# Patient Record
Sex: Female | Born: 1960
Health system: Southern US, Community
[De-identification: ages and names within clinical notes are randomized; demographics above are authoritative.]

## PROBLEM LIST (undated history)

## (undated) DIAGNOSIS — F319 Bipolar disorder, unspecified: Secondary | ICD-10-CM

## (undated) DIAGNOSIS — E785 Hyperlipidemia, unspecified: Secondary | ICD-10-CM

## (undated) DIAGNOSIS — F419 Anxiety disorder, unspecified: Secondary | ICD-10-CM

## (undated) DIAGNOSIS — E119 Type 2 diabetes mellitus without complications: Secondary | ICD-10-CM

## (undated) DIAGNOSIS — F1910 Other psychoactive substance abuse, uncomplicated: Secondary | ICD-10-CM

## (undated) DIAGNOSIS — F329 Major depressive disorder, single episode, unspecified: Secondary | ICD-10-CM

## (undated) DIAGNOSIS — F32A Depression, unspecified: Secondary | ICD-10-CM

## (undated) DIAGNOSIS — Z720 Tobacco use: Secondary | ICD-10-CM

## (undated) DIAGNOSIS — F429 Obsessive-compulsive disorder, unspecified: Secondary | ICD-10-CM

## (undated) DIAGNOSIS — M1990 Unspecified osteoarthritis, unspecified site: Secondary | ICD-10-CM

## (undated) HISTORY — DX: Obsessive-compulsive disorder, unspecified: F42.9

## (undated) HISTORY — DX: Anxiety disorder, unspecified: F41.9

## (undated) HISTORY — DX: Type 2 diabetes mellitus without complications: E11.9

## (undated) HISTORY — DX: Depression, unspecified: F32.A

## (undated) HISTORY — PX: TONSILLECTOMY: SUR1361

## (undated) HISTORY — PX: TUBAL LIGATION: SHX77

## (undated) HISTORY — DX: Bipolar disorder, unspecified: F31.9

## (undated) HISTORY — DX: Tobacco use: Z72.0

## (undated) HISTORY — DX: Major depressive disorder, single episode, unspecified: F32.9

## (undated) HISTORY — PX: DILATION AND CURETTAGE OF UTERUS: SHX78

## (undated) HISTORY — DX: Other psychoactive substance abuse, uncomplicated: F19.10

## (undated) HISTORY — DX: Hyperlipidemia, unspecified: E78.5

---

## 1998-06-12 ENCOUNTER — Other Ambulatory Visit: Admission: RE | Admit: 1998-06-12 | Discharge: 1998-06-12 | Payer: Self-pay | Admitting: *Deleted

## 1999-01-01 ENCOUNTER — Emergency Department (HOSPITAL_COMMUNITY): Admission: EM | Admit: 1999-01-01 | Discharge: 1999-01-01 | Payer: Self-pay | Admitting: Emergency Medicine

## 1999-01-01 ENCOUNTER — Encounter: Payer: Self-pay | Admitting: Emergency Medicine

## 2001-08-28 ENCOUNTER — Other Ambulatory Visit: Admission: RE | Admit: 2001-08-28 | Discharge: 2001-08-28 | Payer: Self-pay | Admitting: Internal Medicine

## 2001-09-04 ENCOUNTER — Encounter: Payer: Self-pay | Admitting: Internal Medicine

## 2001-09-04 ENCOUNTER — Encounter: Admission: RE | Admit: 2001-09-04 | Discharge: 2001-09-04 | Payer: Self-pay | Admitting: Internal Medicine

## 2002-11-01 ENCOUNTER — Other Ambulatory Visit: Admission: RE | Admit: 2002-11-01 | Discharge: 2002-11-01 | Payer: Self-pay | Admitting: Obstetrics and Gynecology

## 2004-07-09 ENCOUNTER — Ambulatory Visit: Payer: Self-pay | Admitting: Internal Medicine

## 2004-07-13 ENCOUNTER — Ambulatory Visit: Payer: Self-pay | Admitting: Internal Medicine

## 2005-01-07 ENCOUNTER — Ambulatory Visit: Payer: Self-pay | Admitting: Internal Medicine

## 2005-01-11 ENCOUNTER — Ambulatory Visit: Payer: Self-pay | Admitting: Internal Medicine

## 2005-04-09 ENCOUNTER — Ambulatory Visit: Payer: Self-pay | Admitting: Internal Medicine

## 2005-04-15 ENCOUNTER — Ambulatory Visit: Payer: Self-pay | Admitting: Internal Medicine

## 2005-06-04 ENCOUNTER — Ambulatory Visit: Payer: Self-pay | Admitting: Internal Medicine

## 2005-06-18 ENCOUNTER — Ambulatory Visit: Payer: Self-pay | Admitting: Internal Medicine

## 2005-08-05 ENCOUNTER — Ambulatory Visit: Payer: Self-pay | Admitting: Internal Medicine

## 2005-08-13 ENCOUNTER — Ambulatory Visit: Payer: Self-pay | Admitting: Internal Medicine

## 2005-10-30 ENCOUNTER — Ambulatory Visit (HOSPITAL_COMMUNITY): Admission: RE | Admit: 2005-10-30 | Discharge: 2005-10-30 | Payer: Self-pay | Admitting: Obstetrics and Gynecology

## 2005-10-30 ENCOUNTER — Encounter (INDEPENDENT_AMBULATORY_CARE_PROVIDER_SITE_OTHER): Payer: Self-pay | Admitting: Specialist

## 2005-12-03 ENCOUNTER — Ambulatory Visit: Payer: Self-pay | Admitting: Internal Medicine

## 2005-12-10 ENCOUNTER — Ambulatory Visit: Payer: Self-pay | Admitting: Internal Medicine

## 2006-06-11 ENCOUNTER — Ambulatory Visit: Payer: Self-pay | Admitting: Internal Medicine

## 2006-06-11 LAB — CONVERTED CEMR LAB
CO2: 28 meq/L (ref 19–32)
Calcium: 9.3 mg/dL (ref 8.4–10.5)
Chloride: 104 meq/L (ref 96–112)
Creatinine, Ser: 0.9 mg/dL (ref 0.4–1.2)
GFR calc non Af Amer: 72 mL/min
Glucose, Bld: 150 mg/dL — ABNORMAL HIGH (ref 70–99)

## 2006-06-17 ENCOUNTER — Ambulatory Visit: Payer: Self-pay | Admitting: Internal Medicine

## 2006-06-25 ENCOUNTER — Ambulatory Visit: Payer: Self-pay | Admitting: Internal Medicine

## 2006-09-05 ENCOUNTER — Ambulatory Visit: Payer: Self-pay | Admitting: Internal Medicine

## 2006-09-16 ENCOUNTER — Ambulatory Visit: Payer: Self-pay | Admitting: Internal Medicine

## 2006-09-16 LAB — CONVERTED CEMR LAB
Albumin: 3.6 g/dL (ref 3.5–5.2)
Alkaline Phosphatase: 51 units/L (ref 39–117)
BUN: 9 mg/dL (ref 6–23)
Basophils Absolute: 0.1 10*3/uL (ref 0.0–0.1)
Bilirubin Urine: NEGATIVE
Creatinine, Ser: 0.8 mg/dL (ref 0.4–1.2)
GFR calc Af Amer: 99 mL/min
HDL: 44.7 mg/dL (ref >39.0)
Hemoglobin, Urine: NEGATIVE
Hemoglobin: 14 g/dL (ref 12.0–15.0)
LDL Cholesterol: 75 mg/dL (ref 0–99)
Leukocytes, UA: NEGATIVE
Lithium Lvl: <0.25 meq/L — ABNORMAL LOW (ref 0.80–1.40)
MCHC: 34 g/dL (ref 30.0–36.0)
Monocytes Absolute: 0.6 10*3/uL (ref 0.2–0.7)
Monocytes Relative: 7.9 % (ref 3.0–11.0)
Neutro Abs: 3.8 10*3/uL (ref 1.4–7.7)
Potassium: 4.2 meq/L (ref 3.5–5.1)
RDW: 12.3 % (ref 11.5–14.6)
TSH: 1.73 microintl units/mL (ref 0.35–5.50)
Total CHOL/HDL Ratio: 3.1
Triglycerides: 88 mg/dL (ref 0–149)
Urine Glucose: NEGATIVE mg/dL
VLDL: 18 mg/dL (ref 0–40)
pH: 6 (ref 5.0–8.0)

## 2006-09-24 ENCOUNTER — Ambulatory Visit: Payer: Self-pay | Admitting: Internal Medicine

## 2006-10-02 ENCOUNTER — Encounter: Admission: RE | Admit: 2006-10-02 | Discharge: 2006-10-02 | Payer: Self-pay | Admitting: Internal Medicine

## 2006-12-19 DIAGNOSIS — Z8639 Personal history of other endocrine, nutritional and metabolic disease: Secondary | ICD-10-CM | POA: Insufficient documentation

## 2006-12-19 DIAGNOSIS — F411 Generalized anxiety disorder: Secondary | ICD-10-CM

## 2006-12-19 DIAGNOSIS — Z8659 Personal history of other mental and behavioral disorders: Secondary | ICD-10-CM | POA: Insufficient documentation

## 2006-12-19 DIAGNOSIS — F3176 Bipolar disorder, in full remission, most recent episode depressed: Secondary | ICD-10-CM | POA: Insufficient documentation

## 2006-12-19 DIAGNOSIS — Z87898 Personal history of other specified conditions: Secondary | ICD-10-CM | POA: Insufficient documentation

## 2007-01-21 ENCOUNTER — Ambulatory Visit: Payer: Self-pay | Admitting: Internal Medicine

## 2007-04-14 ENCOUNTER — Telehealth: Payer: Self-pay | Admitting: Internal Medicine

## 2007-04-15 ENCOUNTER — Telehealth: Payer: Self-pay | Admitting: Internal Medicine

## 2007-04-16 ENCOUNTER — Ambulatory Visit: Payer: Self-pay | Admitting: Internal Medicine

## 2007-04-16 DIAGNOSIS — Z8639 Personal history of other endocrine, nutritional and metabolic disease: Secondary | ICD-10-CM | POA: Insufficient documentation

## 2007-04-16 DIAGNOSIS — I10 Essential (primary) hypertension: Secondary | ICD-10-CM | POA: Insufficient documentation

## 2007-04-16 DIAGNOSIS — E119 Type 2 diabetes mellitus without complications: Secondary | ICD-10-CM

## 2007-04-16 HISTORY — DX: Type 2 diabetes mellitus without complications: E11.9

## 2007-04-16 LAB — CONVERTED CEMR LAB
ALT: 25 units/L (ref 0–35)
BUN: 8 mg/dL (ref 6–23)
Bilirubin, Direct: 0.1 mg/dL (ref 0.0–0.3)
Calcium: 9 mg/dL (ref 8.4–10.5)
Cholesterol: 165 mg/dL (ref 0–200)
Eosinophils Absolute: 0.2 10*3/uL (ref 0.0–0.6)
Eosinophils Relative: 2.1 % (ref 0.0–5.0)
GFR calc Af Amer: 86 mL/min
GFR calc non Af Amer: 71 mL/min
Glucose, Bld: 101 mg/dL — ABNORMAL HIGH (ref 70–99)
HDL: 50 mg/dL (ref >39.0)
Lymphocytes Relative: 34.3 % (ref 12.0–46.0)
MCV: 87.7 fL (ref 78.0–100.0)
Monocytes Relative: 6.8 % (ref 3.0–11.0)
Neutro Abs: 4.1 10*3/uL (ref 1.4–7.7)
Platelets: 237 10*3/uL (ref 150–400)
Potassium: 4.3 meq/L (ref 3.5–5.1)
TSH: 2.12 microintl units/mL (ref 0.35–5.50)
Triglycerides: 116 mg/dL (ref 0–149)

## 2007-04-21 ENCOUNTER — Ambulatory Visit: Payer: Self-pay | Admitting: Internal Medicine

## 2007-04-27 ENCOUNTER — Emergency Department (HOSPITAL_COMMUNITY): Admission: EM | Admit: 2007-04-27 | Discharge: 2007-04-27 | Payer: Self-pay | Admitting: Family Medicine

## 2007-06-09 ENCOUNTER — Ambulatory Visit: Payer: Self-pay | Admitting: Internal Medicine

## 2007-06-18 ENCOUNTER — Encounter: Payer: Self-pay | Admitting: Internal Medicine

## 2007-07-03 ENCOUNTER — Telehealth: Payer: Self-pay | Admitting: Internal Medicine

## 2007-08-03 ENCOUNTER — Telehealth: Payer: Self-pay | Admitting: Internal Medicine

## 2007-09-21 ENCOUNTER — Ambulatory Visit: Payer: Self-pay | Admitting: Internal Medicine

## 2007-09-21 ENCOUNTER — Encounter (INDEPENDENT_AMBULATORY_CARE_PROVIDER_SITE_OTHER): Payer: Self-pay | Admitting: Family Medicine

## 2007-09-21 ENCOUNTER — Ambulatory Visit: Payer: Self-pay | Admitting: *Deleted

## 2007-09-21 LAB — CONVERTED CEMR LAB
ALT: 21 units/L (ref 0–35)
AST: 17 units/L (ref 0–37)
Albumin: 4.2 g/dL (ref 3.5–5.2)
Alkaline Phosphatase: 58 units/L (ref 39–117)
Glucose, Bld: 70 mg/dL (ref 70–99)
LDL Cholesterol: 81 mg/dL (ref 0–99)
Microalb, Ur: 0.41 mg/dL (ref 0.00–1.89)
Potassium: 4.3 meq/L (ref 3.5–5.3)
Sodium: 138 meq/L (ref 135–145)
Total Protein: 7 g/dL (ref 6.0–8.3)
Triglycerides: 183 mg/dL — ABNORMAL HIGH (ref <150)
VLDL: 37 mg/dL (ref 0–40)

## 2007-10-29 ENCOUNTER — Ambulatory Visit: Payer: Self-pay | Admitting: Internal Medicine

## 2007-11-26 ENCOUNTER — Ambulatory Visit: Payer: Self-pay | Admitting: Internal Medicine

## 2007-12-09 ENCOUNTER — Ambulatory Visit: Payer: Self-pay | Admitting: Internal Medicine

## 2007-12-30 ENCOUNTER — Ambulatory Visit: Payer: Self-pay | Admitting: Internal Medicine

## 2007-12-30 ENCOUNTER — Encounter: Payer: Self-pay | Admitting: Family Medicine

## 2007-12-30 ENCOUNTER — Encounter (INDEPENDENT_AMBULATORY_CARE_PROVIDER_SITE_OTHER): Payer: Self-pay | Admitting: Family Medicine

## 2007-12-30 LAB — CONVERTED CEMR LAB
Basophils Absolute: 0 10*3/uL (ref 0.0–0.1)
Chlamydia, DNA Probe: NEGATIVE
GC Probe Amp, Genital: NEGATIVE
Lithium Lvl: 0.33 meq/L — ABNORMAL LOW (ref 0.80–1.40)
Lymphocytes Relative: 34 % (ref 12–46)
Lymphs Abs: 2.4 10*3/uL (ref 0.7–4.0)
Neutro Abs: 3.9 10*3/uL (ref 1.7–7.7)
Neutrophils Relative %: 56 % (ref 43–77)
Platelets: 230 10*3/uL (ref 150–400)
RDW: 13.9 % (ref 11.5–15.5)
TSH: 2.056 microintl units/mL (ref 0.350–4.50)
WBC: 7.1 10*3/uL (ref 4.0–10.5)

## 2008-01-12 ENCOUNTER — Ambulatory Visit (HOSPITAL_COMMUNITY): Admission: RE | Admit: 2008-01-12 | Discharge: 2008-01-12 | Payer: Self-pay | Admitting: Family Medicine

## 2008-01-15 ENCOUNTER — Ambulatory Visit: Payer: Self-pay | Admitting: Internal Medicine

## 2008-05-17 ENCOUNTER — Telehealth: Payer: Self-pay | Admitting: Internal Medicine

## 2008-06-24 ENCOUNTER — Emergency Department (HOSPITAL_COMMUNITY): Admission: EM | Admit: 2008-06-24 | Discharge: 2008-06-24 | Payer: Self-pay | Admitting: Family Medicine

## 2008-07-06 ENCOUNTER — Ambulatory Visit: Payer: Self-pay | Admitting: Internal Medicine

## 2008-09-22 ENCOUNTER — Ambulatory Visit: Payer: Self-pay | Admitting: Internal Medicine

## 2008-10-14 ENCOUNTER — Inpatient Hospital Stay (HOSPITAL_COMMUNITY): Admission: AD | Admit: 2008-10-14 | Discharge: 2008-10-14 | Payer: Self-pay | Admitting: Obstetrics & Gynecology

## 2008-10-20 ENCOUNTER — Inpatient Hospital Stay (HOSPITAL_COMMUNITY): Admission: RE | Admit: 2008-10-20 | Discharge: 2008-10-20 | Payer: Self-pay | Admitting: Obstetrics & Gynecology

## 2008-12-21 ENCOUNTER — Ambulatory Visit: Payer: Self-pay | Admitting: Internal Medicine

## 2009-04-03 ENCOUNTER — Ambulatory Visit: Payer: Self-pay | Admitting: Internal Medicine

## 2009-04-14 ENCOUNTER — Encounter (INDEPENDENT_AMBULATORY_CARE_PROVIDER_SITE_OTHER): Payer: Self-pay | Admitting: Adult Health

## 2009-04-14 ENCOUNTER — Ambulatory Visit: Payer: Self-pay | Admitting: Internal Medicine

## 2009-04-14 LAB — CONVERTED CEMR LAB
ALT: 27 units/L (ref 0–35)
AST: 26 units/L (ref 0–37)
Albumin: 4.6 g/dL (ref 3.5–5.2)
Alkaline Phosphatase: 79 units/L (ref 39–117)
BUN: 16 mg/dL (ref 6–23)
Calcium: 9.7 mg/dL (ref 8.4–10.5)
Chloride: 104 meq/L (ref 96–112)
Potassium: 4.1 meq/L (ref 3.5–5.3)

## 2009-07-29 ENCOUNTER — Emergency Department (HOSPITAL_COMMUNITY): Admission: EM | Admit: 2009-07-29 | Discharge: 2009-07-29 | Payer: Self-pay | Admitting: Family Medicine

## 2009-08-16 ENCOUNTER — Emergency Department (HOSPITAL_COMMUNITY): Admission: EM | Admit: 2009-08-16 | Discharge: 2009-08-16 | Payer: Self-pay | Admitting: Family Medicine

## 2009-10-04 ENCOUNTER — Encounter (INDEPENDENT_AMBULATORY_CARE_PROVIDER_SITE_OTHER): Payer: Self-pay | Admitting: Adult Health

## 2009-10-04 ENCOUNTER — Ambulatory Visit: Payer: Self-pay | Admitting: Internal Medicine

## 2009-10-04 LAB — CONVERTED CEMR LAB
ALT: 25 units/L (ref 0–35)
CO2: 25 meq/L (ref 19–32)
Calcium: 9.3 mg/dL (ref 8.4–10.5)
Chloride: 105 meq/L (ref 96–112)
Cholesterol: 204 mg/dL — ABNORMAL HIGH (ref 0–200)
Glucose, Bld: 95 mg/dL (ref 70–99)
Microalb, Ur: 0.5 mg/dL (ref 0.00–1.89)
Sodium: 140 meq/L (ref 135–145)
Total Bilirubin: 0.4 mg/dL (ref 0.3–1.2)
Total Protein: 7.3 g/dL (ref 6.0–8.3)
Triglycerides: 268 mg/dL — ABNORMAL HIGH (ref <150)
VLDL: 54 mg/dL — ABNORMAL HIGH (ref 0–40)
Vit D, 25-Hydroxy: 47 ng/mL (ref 30–89)

## 2009-10-31 ENCOUNTER — Inpatient Hospital Stay (HOSPITAL_COMMUNITY): Admission: AD | Admit: 2009-10-31 | Discharge: 2009-10-31 | Payer: Self-pay | Admitting: Family Medicine

## 2009-10-31 ENCOUNTER — Ambulatory Visit: Payer: Self-pay | Admitting: Obstetrics & Gynecology

## 2009-11-02 ENCOUNTER — Other Ambulatory Visit: Admission: RE | Admit: 2009-11-02 | Discharge: 2009-11-02 | Payer: Self-pay | Admitting: Obstetrics and Gynecology

## 2009-11-02 ENCOUNTER — Ambulatory Visit: Payer: Self-pay | Admitting: Obstetrics and Gynecology

## 2009-11-02 LAB — CONVERTED CEMR LAB
FSH: 58.5 milliintl units/mL
LH: 44.7 milliintl units/mL

## 2010-03-26 ENCOUNTER — Encounter (INDEPENDENT_AMBULATORY_CARE_PROVIDER_SITE_OTHER): Payer: Self-pay | Admitting: *Deleted

## 2010-03-26 LAB — CONVERTED CEMR LAB
Chlamydia, Swab/Urine, PCR: NEGATIVE
GC Probe Amp, Urine: NEGATIVE
Hgb A1c MFr Bld: 5.6 % (ref <5.7)

## 2010-06-04 ENCOUNTER — Encounter: Payer: Self-pay | Admitting: Internal Medicine

## 2010-06-04 ENCOUNTER — Ambulatory Visit
Admission: RE | Admit: 2010-06-04 | Discharge: 2010-06-04 | Payer: Self-pay | Source: Home / Self Care | Attending: Internal Medicine | Admitting: Internal Medicine

## 2010-06-04 DIAGNOSIS — M2550 Pain in unspecified joint: Secondary | ICD-10-CM | POA: Insufficient documentation

## 2010-06-05 ENCOUNTER — Telehealth: Payer: Self-pay | Admitting: Internal Medicine

## 2010-06-19 ENCOUNTER — Telehealth: Payer: Self-pay | Admitting: Internal Medicine

## 2010-06-28 NOTE — Progress Notes (Signed)
Summary: Call Report  Phone Note Other Incoming   Caller: [facility] Summary of Call: [facility] Triage Call Report Triage Record Num: [id number] Operator: Audelia Hives Patient Name: Yasmine Kilbourne Call Date & Time: 06/04/2010 7:14:16PM Patient Phone: [phone] PCP: Sonda Primes Patient Gender: Female PCP Fax : [phone] Patient DOB: 04/13/1961 Practice Name: [facility] Reason for Call: Jacki Cones calling regarding scripts for Tramadol 50 mg and Ibuprofen 600mg . States she saw Dr. Posey Rea for hip pain 06/04/10 at 1600 and meds were to be sent to [facility] and have not been received. Onset of hip pain x 1 year. Was given a shot today for pain and advised to F/U in 1 month. Emergent s/s for Hip-Non injury r/o per protocol except for see in 24 hours. Advised on Motrin 800 mg q 8 hrs as per office SO. Pt to call office in am for Tramadol. Protocol(s) Used: Hip Non-Injury Recommended Outcome per Protocol: See Provider within 24 hours Reason for Outcome: Persistent or worsening pain OR impaired functioning (change in normal gait, inability to remove socks or cross legs) even when following prescribed treatment Care Advice:  ~ Call provider if symptoms worsen or new symptoms develop. Limit weight-bearing activity until evaluated by provider. Avoid movements or exercises that aggravate symptoms, such as jogging, stair-climbing, prolonged standing, etc.  ~  ~ SYMPTOM / CONDITION MANAGEMENT 01/ Initial call taken by: Margaret Pyle, CMA,  June 05, 2010 8:34 AM  Follow-up for Phone Call        Record shows Rx went through Follow-up by: Tresa Garter MD,  June 05, 2010 1:04 PM

## 2010-06-28 NOTE — Miscellaneous (Signed)
Summary: Procedure Consent  Procedure Consent   Imported By: Lester [place] 06/08/2010 10:59:10  _____________________________________________________________________  External Attachment:    Type:   Image     Comment:   External Document

## 2010-06-28 NOTE — Assessment & Plan Note (Signed)
Summary: BACK AND HIP PAIN--LAST APPT W/DR AVP:  2009-SELF PAY/$125-BI.Marland KitchenMarland Kitchen   Vital Signs:  Patient profile:   50 year old female Height:      67 inches Weight:      197 pounds BMI:     30.97 Temp:     99.2 degrees F oral Pulse rate:   80 / minute Pulse rhythm:   regular Resp:     16 per minute BP sitting:   148 / 98  (left arm) Cuff size:   regular  Vitals Entered By: Lanier Prude, CMA(AAMA) (June 04, 2010 3:51 PM)  Procedure Note  Injections: The patient complains of pain and inflammation. Indication: acute pain Consent signed: yes  Procedure # 1: joint injection    Region: lateral    Location: L hip    Technique: 25 g needle    Medication: 80 mg depomedrol    Anesthesia: 4.0 ml 1% lidocaine w/o epinephrine    Comment: Skin was inj w/2cc 2% Lido prior Risks including but not limited by incomplete procedure, bleeding, infection, recurrence were discussed with the patient. Consent form was signed. Tolerated well. Complicatons - none. Good pain relief following the procedure.   Cleaned and prepped with: alcohol and betadine Wound dressing: bandaid  CC: Lt hip/LBP  Is Patient Diabetic? No Comments pt is not taking Paxil, Lovastatin, Clonazepam, Vit D, Mobic, Flexeril or Ciclopirox   CC:  Lt hip/LBP .  History of Present Illness: C/o L back and L hip severe pain - worse w/standing - bad now - worse w/standing  Current Medications (verified): 1)  Paxil 20 Mg  Tabs (Paroxetine Hcl) .... Once Daily 2)  Lithobid 300 Mg  Tbcr (Lithium Carbonate) .... Three Times A Day 3)  Lovastatin 40 Mg  Tabs (Lovastatin) .... Once Daily 4)  Clonazepam 1 Mg  Tabs (Clonazepam) .Marland Kitchen.. 1 By Mouth Two Times A Day Prn 5)  Vitamin D3 1000 Unit  Tabs (Cholecalciferol) .Marland Kitchen.. 1 Qd 6)  Mobic 15 Mg Tabs (Meloxicam) .... or 1 By Mouth Once Daily Pc Prn 7)  Flexeril 10 Mg Tabs (Cyclobenzaprine Hcl) .Marland Kitchen.. 1 By Mouth Two Times A Day As Needed Back Spasms 8)  Ciclopirox 8 %  Soln (Ciclopirox) .... Use  Once Daily As Directed For 12 Month  Denied Need Office Visit 9)  Fish Oil 1000 Mg Caps (Omega-3 Fatty Acids) .... 2 By Mouth Once Daily 10)  Oyster Shell Calcium 500 Mg Tabs (Oyster Shell) .Marland Kitchen.. 1 By Mouth Once Daily 11)  Effexor Xr 37.5 Mg Xr24h-Cap (Venlafaxine Hcl) .Marland Kitchen.. 1 By Mouth Once Daily 12)  Tylenol Arthritis Pain 650 Mg Cr-Tabs (Acetaminophen) .... 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    Physical Exam  General:  Well-developed,well-nourished,in no acute distress; alert,appropriate and cooperative throughout examination Eyes:  No corneal or conjunctival inflammation noted. EOMI. Perrla. Funduscopic exam benign, without hemorrhages, exudates or papilledema. Vision grossly normal. Mouth:  Oral mucosa and oropharynx without lesions or exudates.  Teeth in good repair. Lungs:  Normal respiratory effort, chest expands symmetrically. Lungs are clear to auscultation, no crackles  or wheezes. Heart:  Normal rate and regular rhythm. S1 and S2 normal without gallop, murmur, click, rub or other extra sounds. Msk:  L troch major is very tender LS OK B hips NT Neurologic:  Strait leg elev is neg B Skin:  Intact without suspicious lesions or rashes Psych:  Oriented X3, normally interactive, and good eye contact.     Impression & Recommendations:  Problem # 1:  HIP PAIN (ICD-719.45) L Assessment New  Will inject. Her updated medication list for this problem includes:    Mobic 15 Mg Tabs (Meloxicam)  ..... Or 1 by mouth once daily pc prn    Flexeril 10 Mg Tabs (Cyclobenzaprine hcl) .Marland Kitchen... 1 by mouth two times a day as needed back spasms    Tylenol Arthritis Pain 650 Mg Cr-tabs (Acetaminophen) .Marland Kitchen... As directed    Tramadol Hcl 50 Mg Tabs (Tramadol hcl) .Marland Kitchen... 1-2 tabs by mouth two times a day as needed pain    Ibuprofen 600 Mg Tabs (Ibuprofen) .Marland Kitchen... 1 by mouth bid  pc x 1 wk then as needed for  pain She had x rays of her back and hip she said at [facility]   Orders: Joint Aspirate / Injection, Large (20610) Depo- Medrol 80mg  (J1040)  Problem # 2:  LOW BACK PAIN (ICD-724.2) OA Assessment: Unchanged  Her updated medication list for this problem includes:    Mobic 15 Mg Tabs (Meloxicam) ..... Or 1 by mouth once daily pc prn    Flexeril 10 Mg Tabs (Cyclobenzaprine hcl) .Marland Kitchen... 1 by mouth two times a day as needed back spasms    Tylenol Arthritis Pain 650 Mg Cr-tabs (Acetaminophen) .Marland Kitchen... As directed    Tramadol Hcl 50 Mg Tabs (Tramadol hcl) .Marland Kitchen... 1-2 tabs by mouth two times a day as needed pain    Ibuprofen 600 Mg Tabs (Ibuprofen) .Marland Kitchen... 1 by mouth bid  pc x 1 wk then as needed for  pain  Complete Medication List: 1)  Paxil 20 Mg Tabs (Paroxetine hcl) .... Once daily 2)  Lithobid 300 Mg Tbcr (Lithium carbonate) .... Three times a day 3)  Lovastatin 40 Mg Tabs (Lovastatin) .... Once daily 4)  Clonazepam 1 Mg Tabs (Clonazepam) .Marland Kitchen.. 1 by mouth two times a day prn 5)  Vitamin D3 1000 Unit Tabs (Cholecalciferol) .Marland Kitchen.. 1 qd 6)  Mobic 15 Mg Tabs (Meloxicam) .... Or 1 by mouth once daily pc prn 7)  Flexeril 10 Mg Tabs (Cyclobenzaprine hcl) .Marland Kitchen.. 1 by mouth two times a day as needed back spasms 8)  Ciclopirox 8 % Soln (Ciclopirox) .... Use once daily as directed for 12 month  denied need office visit 9)  Fish Oil 1000 Mg Caps (Omega-3 fatty acids) .... 2 by mouth once daily 10)  Oyster Shell Calcium 500 Mg Tabs (Oyster shell) .Marland Kitchen.. 1 by mouth once daily 11)  Effexor Xr 37.5 Mg Xr24h-cap (Venlafaxine hcl)  .Marland Kitchen.. 1 by mouth once daily 12)  Tylenol Arthritis Pain 650 Mg Cr-tabs (Acetaminophen) .... As directed 13)  Tramadol Hcl 50 Mg Tabs (Tramadol hcl) .Marland Kitchen.. 1-2 tabs by mouth two times a day as needed pain 14)  Ibuprofen 600 Mg Tabs (Ibuprofen) .Marland Kitchen.. 1 by mouth bid  pc x 1 wk then as needed for  pain  Patient Instructions: 1)  Please schedule a follow-up appointment in 1 month. 2)  Use stretching and balance exercises that I have provided (15 min. or longer every day)  3)  Ice or heat to the hip Prescriptions: IBUPROFEN  600 MG TABS (IBUPROFEN) 1 by mouth bid  pc x 1 wk then as needed for  pain  #60 x 3   Entered and Authorized by:   Tresa Garter MD   Signed by:   Tresa Garter MD on 06/04/2010   Method used:   Electronically to        [facility]* (retail)       [address] [place], [place]  [address]       Ph: [phone]       Fax: [phone]   RxID:   [id number] TRAMADOL HCL 50 MG TABS (TRAMADOL HCL) 1-2 tabs by mouth two times a day as needed pain  #60 x 1   Entered and Authorized by:   Tresa Garter MD   Signed by:   Tresa Garter MD on 06/04/2010   Method used:   Electronically to        [facility]* (retail)       [address] [place], [place]  [address]       Ph: [phone]       Fax: [phone]   RxID:   [id number]    Orders Added: 1)  Est. Patient Level III [50093] 2)  Joint Aspirate / Injection, Large [20610] 3)  Depo- Medrol 80mg  [J1040]

## 2010-07-04 NOTE — Progress Notes (Signed)
Summary: DX  Phone Note Call from Patient   Caller: Patient Call For: [phone] Summary of Call: Patient called requesting the dx given for her hip pain and last appt.Alvy Beal Archie CMA  June 19, 2010 4:49 PM   Follow-up for Phone Call        Hip trochanteric bursitis Follow-up by: Tresa Garter MD,  June 20, 2010 4:41 PM  Additional Follow-up for Phone Call Additional follow up Details #1::        Pt informed. She told me that she was told she could not take ibuprofen for pain due to change in  meds. I updated EMR med list w/the following changes.  Med Changes: Lithium 300mg  two times a day Clonazepam 0.5  half tab two times a day  lisinopril 10mg  once daily  Additional Follow-up by: Lamar Sprinkles, CMA,  June 21, 2010 12:30 PM    Additional Follow-up for Phone Call Additional follow up Details #2::    She can take Ibuprofen in place of Mobic Follow-up by: Tresa Garter MD,  June 21, 2010 12:40 PM  Additional Follow-up for Phone Call Additional follow up Details #3:: Details for Additional Follow-up Action Taken: left mess to call office back.....................Marland KitchenLamar Sprinkles, CMA  June 21, 2010 3:31 PM   LMOVM.Marland KitchenAlvy Beal Archie CMA  June 22, 2010 5:06 PM   left mess to call office back.....................Marland KitchenLamar Sprinkles, CMA  June 25, 2010 2:58 PM   Returned call to patient x4// closing phone note until pt calls.Alvy Beal Archie CMA  June 25, 2010 4:54 PM   New/Updated Medications: LITHOBID 300 MG  TBCR (LITHIUM CARBONATE) two times a day CLONAZEPAM 0.5 MG TABS (CLONAZEPAM) one half tab two times a day LISINOPRIL 10 MG TABS (LISINOPRIL) 1 once daily

## 2010-08-13 LAB — URINALYSIS, ROUTINE W REFLEX MICROSCOPIC
Bilirubin Urine: NEGATIVE
Glucose, UA: NEGATIVE mg/dL
Hgb urine dipstick: NEGATIVE
Ketones, ur: NEGATIVE mg/dL
Protein, ur: NEGATIVE mg/dL
Urobilinogen, UA: 0.2 mg/dL (ref 0.0–1.0)

## 2010-08-13 LAB — WET PREP, GENITAL: Trich, Wet Prep: NONE SEEN

## 2010-08-13 LAB — CBC
MCV: 88.6 fL (ref 78.0–100.0)
Platelets: 222 10*3/uL (ref 150–400)
RBC: 4.27 MIL/uL (ref 3.87–5.11)
WBC: 9.9 10*3/uL (ref 4.0–10.5)

## 2010-08-13 LAB — GC/CHLAMYDIA PROBE AMP, GENITAL: GC Probe Amp, Genital: NEGATIVE

## 2010-08-13 LAB — POCT PREGNANCY, URINE
Preg Test, Ur: NEGATIVE
Preg Test, Ur: NEGATIVE

## 2010-09-04 LAB — WET PREP, GENITAL
Clue Cells Wet Prep HPF POC: NONE SEEN
Yeast Wet Prep HPF POC: NONE SEEN

## 2010-09-10 LAB — POCT URINALYSIS DIP (DEVICE)
Glucose, UA: NEGATIVE mg/dL
Specific Gravity, Urine: 1.02 (ref 1.005–1.030)
Urobilinogen, UA: 0.2 mg/dL (ref 0.0–1.0)
pH: 6 (ref 5.0–8.0)

## 2010-10-09 NOTE — Assessment & Plan Note (Signed)
[facility]                           PRIMARY CARE OFFICE NOTE   NAME:CROFTSharlyne, Koeneman                       MRN:          [id number]  DATE:09/24/2006                            DOB:          [birth year]    Patient is a 50 year old female who presents for a wellness examination.   Past medical history, family history, and social history as per April 15, 2005 note.  In the last few months, she was diagnosed with type 2  diabetes; however, improved nutrition a lot with a strict diet and  weight loss.   CURRENT MEDICATIONS:  Multivitamin daily.   ALLERGIES:  None.   REVIEW OF SYSTEMS:  No chest pain or shortness of breath.  No syncope.  Occasional problems with anxiety.  She denies being depressed.  The rest  of the 18-point review of systems is negative.   PHYSICAL EXAMINATION:  VITAL SIGNS:  Blood pressure 111/87, pulse 68,  temperature 98.5.  Weight 180 pounds.  GENERAL:  Looks well.  HEENT:  With moist mucosa.  NECK:  There is a firm, about 1 cm lymph gland under the left mandibula.  The salivary gland does not seem to be enlarged.  Nodular thyroid.  LUNGS:  Clear.  No wheeze or rales.  HEART:  S1 and S2.  No murmur, no gallop.  ABDOMEN:  Soft, no organomegaly.  No masses felt.  EXTREMITIES:  Lower extremities without edema.  Calves are nontender.  Pulse is normal.  NEUROLOGIC:  She is alert and appropriate.  Denies being depressed.  SKIN:  Clear.   Labs from September 16, 2006.  CBC normal.  Glucose 102.  LFTs are normal.  Cholesterol is 137.  A1C 5.3%.  TSH normal.  Urinalysis normal.  Lithium  level less than 0.25.   EKG normal.   ASSESSMENT/PLAN:  1. Normal well examination:  Age/health-related issues discussed.      Healthy lifestyle discussed.  Her EKG today is normal.  Advised to      take vitamin D 1000 units daily.  Her regular gynecologic care with      Dr. Dareen Piano.  Her appointment is pending on October 26, 2006.  Obtain  chest x-ray next year.  2. Anxiety:  She went off BuSpar.  She can take BuSpar p.r.n.,      otherwise continue with Paxil and lithium.  Doing well.  3. Enlarged, firm lymph gland -  Left submandibular.  ENT consultation      pending.  No evidence of sialadenitis at present.  4. Enlarged thyroid:  Obtain thyroid ultrasound.  5. Diabetes:  Resolved with strict diet, exercise, and weight loss.      Will monitor.     Georgina Quint. Plotnikov, MD  Electronically Signed    AVP/MedQ  DD: 09/24/2006  DT: 09/25/2006  Job #: [id number]   cc:   Malva Limes, M.D.

## 2010-10-12 NOTE — Op Note (Signed)
NAME:  Tammy Barr, Tammy Barr NO.:  [id number]   MEDICAL RECORD NO.:  [id number]          PATIENT TYPE:  AMB   LOCATION:  SDC                           FACILITY:  [facility]   PHYSICIAN:  Malva Limes, M.D.    DATE OF BIRTH:  04-19-1961   DATE OF PROCEDURE:  10/30/2005  DATE OF DISCHARGE:                                 OPERATIVE REPORT   DIAGNOSIS:  Menorrhagia.   POSTOPERATIVE DIAGNOSIS:  Menorrhagia.   PROCEDURE:  1.  Dilation curettage.  2.  NovaSure endometrial ablation.   SURGEON:  Dr. Dareen Piano.   ANESTHESIA:  General.   ANTIBIOTICS:  Ancef 1 g.   DRAINS:  Red rubber catheter in bladder.   ESTIMATED BLOOD LOSS:  50 mL.   COMPLICATIONS:  None.   SPECIMENS:  Endometrial curettings sent to pathology.   INDICATIONS:  [name] is a 50 year old white female who presents with a  two-month history of heavy menstrual flows.  Most recently, the patient has  been bleeding for approximately two weeks.  The patient was given several  options, use of oral contraceptive pills, Jearld Adjutant IUD or a D&C with NovaSure  ablation, and she elected to proceed with the last one.   PROCEDURE:  The patient was taken to the operating room where she was placed  in a dorsal supine position, and general anesthetic was administered without  complications.  She was then placed in dorsal lithotomy position.  She was  prepped and draped in the usual fashion for this procedure.  A single-tooth  tenaculum applied to the anterior cervical lip.  The uterus then sounded to  9 cm.  The cervical os was serially dilated.  The endocervical length was  measured at 3 cm, giving a cavity length of 6 cm.  Once this was  accomplished, sharp curettage was then performed with tissue being sent to  pathology.  The amount of tissue was consistent with an endometrial polyp.  NovaSure device was placed into the uterine cavity.  Seating procedure was  performed.  The intracavitary width was 4.6 cm.  At this  point, a seal test  was performed and passed.  The device was then turned on for a total of 73  seconds at 152  watts.  The patient tolerated the procedure well.  She was taken to recovery  room in stable condition.  Instrument and lap counts were correct x1.  The  patient was discharged to home with Advil to take p.r.n.  She will follow up  in the office in four weeks.           ______________________________  Malva Limes, M.D.     MA/MEDQ  D:  10/30/2005  T:  10/30/2005  Job:  [id number]

## 2010-10-23 ENCOUNTER — Inpatient Hospital Stay (INDEPENDENT_AMBULATORY_CARE_PROVIDER_SITE_OTHER)
Admission: RE | Admit: 2010-10-23 | Discharge: 2010-10-23 | Disposition: A | Discharge status: Home / Self Care | Payer: Self-pay | Source: Ambulatory Visit | Attending: Family Medicine | Admitting: Family Medicine

## 2010-10-23 DIAGNOSIS — M76899 Other specified enthesopathies of unspecified lower limb, excluding foot: Secondary | ICD-10-CM

## 2010-12-06 ENCOUNTER — Telehealth: Payer: Self-pay

## 2010-12-06 NOTE — Telephone Encounter (Signed)
Called patient, lmovm for pt to call back regarding advisement. I also advise her to call back asap and make appt with him for fri 7/13 before slots are no longer avail.

## 2010-12-06 NOTE — Telephone Encounter (Signed)
Patient called lmovm on triage c/o swollen,red, painful tongue. She also describe it as cancer sores, deep cracks with white spots on it. There are No available appts with any MD today, Please advise thanks

## 2010-12-06 NOTE — Telephone Encounter (Signed)
UC or ER if sick If not: use Benadryl 25 mg, Tylenol 650 mg and Sudafed 60 mg qid prn. Use Mylanta - hold in mouth. OV tomorrow Thx

## 2011-02-12 ENCOUNTER — Inpatient Hospital Stay (INDEPENDENT_AMBULATORY_CARE_PROVIDER_SITE_OTHER)
Admission: RE | Admit: 2011-02-12 | Discharge: 2011-02-12 | Disposition: A | Discharge status: Home / Self Care | Payer: Self-pay | Source: Ambulatory Visit | Attending: Family Medicine | Admitting: Family Medicine

## 2011-02-12 DIAGNOSIS — K140 Glossitis: Secondary | ICD-10-CM

## 2011-02-12 LAB — POCT I-STAT, CHEM 8
BUN: 8 mg/dL (ref 6–23)
Creatinine, Ser: 0.7 mg/dL (ref 0.50–1.10)
Glucose, Bld: 111 mg/dL — ABNORMAL HIGH (ref 70–99)
Potassium: 4.1 mEq/L (ref 3.5–5.1)
Sodium: 141 mEq/L (ref 135–145)
TCO2: 26 mmol/L (ref 0–100)

## 2011-02-13 ENCOUNTER — Encounter: Payer: Self-pay | Admitting: Internal Medicine

## 2011-02-13 ENCOUNTER — Ambulatory Visit (INDEPENDENT_AMBULATORY_CARE_PROVIDER_SITE_OTHER): Payer: Self-pay | Admitting: Internal Medicine

## 2011-02-13 DIAGNOSIS — Z720 Tobacco use: Secondary | ICD-10-CM | POA: Insufficient documentation

## 2011-02-13 DIAGNOSIS — B002 Herpesviral gingivostomatitis and pharyngotonsillitis: Secondary | ICD-10-CM

## 2011-02-13 DIAGNOSIS — F1020 Alcohol dependence, uncomplicated: Secondary | ICD-10-CM

## 2011-02-13 DIAGNOSIS — F1921 Other psychoactive substance dependence, in remission: Secondary | ICD-10-CM

## 2011-02-13 DIAGNOSIS — F17200 Nicotine dependence, unspecified, uncomplicated: Secondary | ICD-10-CM

## 2011-02-13 MED ORDER — VARENICLINE TARTRATE 0.5 MG PO TABS: 0.5000 mg | ORAL_TABLET | Freq: Two times a day (BID) | ORAL | Status: DC

## 2011-02-13 MED ORDER — VARENICLINE TARTRATE 1 MG PO TABS: ORAL_TABLET | ORAL | Status: DC

## 2011-02-13 MED ORDER — ACYCLOVIR 400 MG PO TABS: 400.0000 mg | ORAL_TABLET | Freq: Three times a day (TID) | ORAL | Status: AC

## 2011-02-13 NOTE — Assessment & Plan Note (Signed)
Chantix info/Rx given

## 2011-02-13 NOTE — Assessment & Plan Note (Signed)
Acyclovir given

## 2011-02-13 NOTE — Progress Notes (Signed)
  Subjective:    Patient ID: Tammy Barr, female    DOB: Nov 13, 1960, 50 y.o.   MRN: [id number]  HPI  C/o mouth sores and L face pain, chills x 1 wk C/o tobacco smoking - wants to quit  Review of Systems  Constitutional: Positive for chills and fatigue.  HENT: Positive for congestion. Negative for sinus pressure.   Eyes: Negative for pain.  Cardiovascular: Negative for chest pain.  Gastrointestinal: Negative for abdominal distention.  Genitourinary: Negative for urgency.  Neurological: Negative for syncope.       Objective:   Physical Exam  Constitutional: She appears well-nourished. No distress.  HENT:  Left Ear: External ear normal.  Mouth/Throat: No oropharyngeal exudate.       L prox tongue ulcer  Eyes: Pupils are equal, round, and reactive to light.  Neck: Neck supple.  Cardiovascular: Normal rate and regular rhythm.   Pulmonary/Chest: She has no wheezes. She has no rales.  Abdominal: She exhibits no distension.  Lymphadenopathy:    She has cervical adenopathy (very old on L per pt).  Skin: She is not diaphoretic. No pallor.          Assessment & Plan:

## 2011-03-04 LAB — POCT URINALYSIS DIP (DEVICE)
Bilirubin Urine: NEGATIVE
Glucose, UA: NEGATIVE
Hgb urine dipstick: NEGATIVE
Specific Gravity, Urine: 1.015

## 2011-03-18 ENCOUNTER — Other Ambulatory Visit: Payer: Self-pay | Admitting: *Deleted

## 2011-03-18 MED ORDER — VARENICLINE TARTRATE 0.5 MG PO TABS: 0.5000 mg | ORAL_TABLET | Freq: Two times a day (BID) | ORAL | Status: DC

## 2011-03-18 MED ORDER — VARENICLINE TARTRATE 1 MG PO TABS: ORAL_TABLET | ORAL | Status: DC

## 2011-04-23 ENCOUNTER — Telehealth: Payer: Self-pay | Admitting: *Deleted

## 2011-04-23 DIAGNOSIS — Z0000 Encounter for general adult medical examination without abnormal findings: Secondary | ICD-10-CM

## 2011-04-23 NOTE — Telephone Encounter (Signed)
Labs entered.

## 2011-05-10 ENCOUNTER — Encounter (HOSPITAL_COMMUNITY): Payer: Self-pay | Admitting: *Deleted

## 2011-05-10 ENCOUNTER — Emergency Department (INDEPENDENT_AMBULATORY_CARE_PROVIDER_SITE_OTHER)
Admission: EM | Admit: 2011-05-10 | Discharge: 2011-05-10 | Disposition: A | Discharge status: Home / Self Care | Payer: PRIVATE HEALTH INSURANCE | Source: Home / Self Care | Attending: Family Medicine | Admitting: Family Medicine

## 2011-05-10 ENCOUNTER — Emergency Department (INDEPENDENT_AMBULATORY_CARE_PROVIDER_SITE_OTHER): Payer: PRIVATE HEALTH INSURANCE

## 2011-05-10 DIAGNOSIS — S8990XA Unspecified injury of unspecified lower leg, initial encounter: Secondary | ICD-10-CM

## 2011-05-10 DIAGNOSIS — S99929A Unspecified injury of unspecified foot, initial encounter: Secondary | ICD-10-CM

## 2011-05-10 DIAGNOSIS — S99919A Unspecified injury of unspecified ankle, initial encounter: Secondary | ICD-10-CM

## 2011-05-10 HISTORY — DX: Unspecified osteoarthritis, unspecified site: M19.90

## 2011-05-10 NOTE — ED Notes (Signed)
Reports twisting right ankle while walking last night.

## 2011-05-10 NOTE — ED Provider Notes (Signed)
Tammy Barr is a 50 year old female who had a right ankle injury 3 days ago.  She was walking across a parking lot when her foot rotated and she developed pain along the dorsal aspect of her foot and distal tibia.   She noted some mild swelling but has been able to walk.  She has been using Tylenol for pain which is helped some.   She has had no significant orthopedic injury to that ankle before.  She feels well otherwise.  PMH reviewed.  ROS as above otherwise neg Medications reviewed. No current facility-administered medications for this encounter.   Current Outpatient Prescriptions  Medication Sig Dispense Refill  . lithium 300 MG tablet Take 300 mg by mouth 2 (two) times daily.        Marland Kitchen PARoxetine (PAXIL) 20 MG tablet Take 20 mg by mouth every morning.        . varenicline (CHANTIX CONTINUING MONTH PAK) 1 MG tablet 1 po bid  60 tablet  4  . varenicline (CHANTIX) 0.5 MG tablet Take 1 tablet (0.5 mg total) by mouth 2 (two) times daily.  60 tablet  0   Exam:  BP 142/91  Pulse 64  Temp(Src) 98 F (36.7 C) (Oral)  Resp 20  SpO2 99% Gen: Well NAD MSK: Normal-appearing right ankle.  Tender to palpation along the anterior distal tibia.  Normal talar tilt pain with anterior drawer but good end points.  Negative squeeze test .  Negative external rotation test of syndesmosis. Nontender over her rest of ankle and foot.  Foot is neurovascularly intact.    X-ray examination: No fractures noted.   Assessment and plan: 50 year old female with ankle injury.  I suspect this is a strain of the dorsiflexion tendons versus possibility of a tibial stress fracture not detected in this acute stage. Plan to place patient in a air splint stirrup brace and followup with orthopedist in 2 weeks or sooner.  I gave a handout on ankle range of motion exercises and discussed Tylenol and icing of the ankle.     Clementeen Graham 05/10/11 2117

## 2011-06-10 ENCOUNTER — Other Ambulatory Visit (HOSPITAL_COMMUNITY)
Admission: RE | Admit: 2011-06-10 | Discharge: 2011-06-10 | Disposition: A | Discharge status: Home / Self Care | Payer: PRIVATE HEALTH INSURANCE | Source: Ambulatory Visit | Attending: Internal Medicine | Admitting: Internal Medicine

## 2011-06-10 ENCOUNTER — Ambulatory Visit (INDEPENDENT_AMBULATORY_CARE_PROVIDER_SITE_OTHER): Payer: PRIVATE HEALTH INSURANCE | Admitting: Internal Medicine

## 2011-06-10 ENCOUNTER — Encounter: Payer: Self-pay | Admitting: Internal Medicine

## 2011-06-10 VITALS — BP 138/98 | HR 80 | Temp 97.9°F | Resp 16 | Ht 66.0 in | Wt 174.0 lb

## 2011-06-10 DIAGNOSIS — Z0000 Encounter for general adult medical examination without abnormal findings: Secondary | ICD-10-CM

## 2011-06-10 DIAGNOSIS — F17200 Nicotine dependence, unspecified, uncomplicated: Secondary | ICD-10-CM

## 2011-06-10 DIAGNOSIS — Z124 Encounter for screening for malignant neoplasm of cervix: Secondary | ICD-10-CM

## 2011-06-10 DIAGNOSIS — F1020 Alcohol dependence, uncomplicated: Secondary | ICD-10-CM

## 2011-06-10 DIAGNOSIS — F1921 Other psychoactive substance dependence, in remission: Secondary | ICD-10-CM

## 2011-06-10 DIAGNOSIS — Z720 Tobacco use: Secondary | ICD-10-CM

## 2011-06-10 DIAGNOSIS — Z01419 Encounter for gynecological examination (general) (routine) without abnormal findings: Secondary | ICD-10-CM | POA: Insufficient documentation

## 2011-06-10 MED ORDER — VITAMIN D 1000 UNITS PO TABS: 1000.0000 [IU] | ORAL_TABLET | Freq: Every day | ORAL | Status: DC

## 2011-06-10 NOTE — Assessment & Plan Note (Signed)
Staying dry °

## 2011-06-10 NOTE — Assessment & Plan Note (Signed)
The patient is here for annual Medicare wellness examination and management of other chronic and acute problems.   The risk factors are reflected in the social history.  The roster of all physicians providing medical care to patient - is listed in the Snapshot section of the chart.  Activities of daily living:  The patient is 100% inedpendent in all ADLs: dressing, toileting, feeding as well as independent mobility  Home safety : The patient has smoke detectors in the home. They wear seatbelts.No firearms at home ( firearms are present in the home, kept in a safe fashion). There is no violence in the home.   There is no risks for hepatitis, STDs or HIV. There is no   history of blood transfusion. They have no travel history to infectious disease endemic areas of the world.  The patient has (has not) seen their dentist in the last six month. They have (not) seen their eye doctor in the last year. They deny (admit to) any hearing difficulty and have not had audiologic testing in the last year.  They do not  have excessive sun exposure. Discussed the need for sun protection: hats, long sleeves and use of sunscreen if there is significant sun exposure.   Diet: the importance of a healthy diet is discussed. They do have a healthy (unhealthy-high fat/fast food) diet.  The patient has a regular exercise program: _no.  The benefits of regular aerobic exercise were discussed.  Depression screen: there are no signs or vegative symptoms of depression- irritability, change in appetite, anhedonia, sadness/tearfullness.  Cognitive assessment: the patient manages all their financial and personal affairs and is actively engaged. They could relate day,date,year and events; recalled 3/3 objects at 3 minutes; performed clock-face test normally.  The following portions of the patient's history were reviewed and updated as appropriate: allergies, current medications, past family history, past medical history,  past  surgical history, past social history  and problem list.  Vision, hearing, body mass index were assessed and reviewed.   During the course of the visit the patient was educated and counseled about appropriate screening and preventive services including : fall prevention , diabetes screening, nutrition counseling, colorectal cancer screening, and recommended immunizations.  Refused mammo, colon, vaccines. Will get labs Asked to d/c smoking

## 2011-06-10 NOTE — Assessment & Plan Note (Signed)
Discussed her relapse 2013

## 2011-06-10 NOTE — Progress Notes (Signed)
  Subjective:    Patient ID: Tammy Barr, female    DOB: 24-Nov-1960, 51 y.o.   MRN: [id number]  HPI  The patient is here for a wellness exam. The patient has been doing well overall without major physical or psychological issues going on lately. The patient needs to address  chronic hypertension that has been well controlled with medicines; to address chronic  hyperlipidemia controlled with medicines as well; and to address type 2 pre diabetes, controlled with medical treatment and diet. C/o L hip OA - severe pain She was unable to d/c smoking   Review of Systems  Constitutional: Negative for fever, chills, diaphoresis, activity change, appetite change, fatigue and unexpected weight change.  HENT: Negative for hearing loss, ear pain, congestion, sore throat, sneezing, mouth sores, neck pain, dental problem, voice change, postnasal drip and sinus pressure.   Eyes: Negative for pain and visual disturbance.  Respiratory: Negative for cough, chest tightness, wheezing and stridor.   Cardiovascular: Negative for chest pain, palpitations and leg swelling.  Gastrointestinal: Negative for nausea, vomiting, abdominal pain, blood in stool, abdominal distention and rectal pain.  Genitourinary: Negative for dysuria, hematuria, decreased urine volume, vaginal bleeding, vaginal discharge, difficulty urinating, vaginal pain and menstrual problem (menopausal).  Musculoskeletal: Negative for back pain, joint swelling and gait problem.  Skin: Negative for color change, rash and wound.  Neurological: Negative for dizziness, tremors, syncope, speech difficulty and light-headedness.  Hematological: Negative for adenopathy.  Psychiatric/Behavioral: Negative for suicidal ideas, hallucinations, behavioral problems, confusion, sleep disturbance, dysphoric mood and decreased concentration. The patient is not hyperactive.        Objective:   Physical Exam  Constitutional: She appears well-developed. No distress.    HENT:  Head: Normocephalic.  Right Ear: External ear normal.  Left Ear: External ear normal.  Nose: Nose normal.  Mouth/Throat: Oropharynx is clear and moist.  Eyes: Conjunctivae are normal. Pupils are equal, round, and reactive to light. Right eye exhibits no discharge. Left eye exhibits no discharge.  Neck: Normal range of motion. Neck supple. No JVD present. No tracheal deviation present. No thyromegaly present.  Cardiovascular: Normal rate, regular rhythm and normal heart sounds.   Pulmonary/Chest: No stridor. No respiratory distress. She has no wheezes.  Abdominal: Soft. Bowel sounds are normal. She exhibits no distension and no mass. There is no tenderness. There is no rebound and no guarding.  Genitourinary: Vagina normal and uterus normal. Guaiac negative stool. No vaginal discharge found.  Musculoskeletal: She exhibits no edema and no tenderness.  Lymphadenopathy:    She has no cervical adenopathy.  Neurological: She displays normal reflexes. No cranial nerve deficit. She exhibits normal muscle tone. Coordination normal.  Skin: No rash noted. No erythema.  Psychiatric: She has a normal mood and affect. Her behavior is normal. Judgment and thought content normal.          Assessment & Plan:

## 2011-06-10 NOTE — Assessment & Plan Note (Signed)
Not using drugs

## 2011-06-12 ENCOUNTER — Telehealth: Payer: Self-pay | Admitting: Internal Medicine

## 2011-06-12 NOTE — Telephone Encounter (Signed)
Stacey, please, inform patient that her PAP was nl Thx  

## 2011-06-13 NOTE — Telephone Encounter (Signed)
Pt informed

## 2011-06-13 NOTE — Telephone Encounter (Signed)
Left message on machine for pt to return my call  

## 2011-06-14 ENCOUNTER — Ambulatory Visit (INDEPENDENT_AMBULATORY_CARE_PROVIDER_SITE_OTHER)
Admission: RE | Admit: 2011-06-14 | Discharge: 2011-06-14 | Disposition: A | Discharge status: Home / Self Care | Payer: PRIVATE HEALTH INSURANCE | Source: Ambulatory Visit | Attending: Internal Medicine | Admitting: Internal Medicine

## 2011-06-14 ENCOUNTER — Ambulatory Visit (INDEPENDENT_AMBULATORY_CARE_PROVIDER_SITE_OTHER): Payer: PRIVATE HEALTH INSURANCE | Admitting: Internal Medicine

## 2011-06-14 VITALS — BP 122/88 | HR 73 | Temp 97.5°F

## 2011-06-14 DIAGNOSIS — M25559 Pain in unspecified hip: Secondary | ICD-10-CM

## 2011-06-14 MED ORDER — MELOXICAM 15 MG PO TABS: 15.0000 mg | ORAL_TABLET | Freq: Every day | ORAL | Status: DC | PRN

## 2011-06-14 NOTE — Progress Notes (Signed)
  Subjective:    Patient ID: Tammy Barr, female    DOB: 01-Jan-1961, 51 y.o.   MRN: [id number]  HPI  C/o L hip pain worse w/walking x 5 years, worse now - severe pain at times Cortisone shot did not help x 1   Review of Systems  Constitutional: Negative for fever, chills, activity change, appetite change, fatigue and unexpected weight change.  HENT: Negative for congestion, mouth sores and sinus pressure.   Eyes: Negative for visual disturbance.  Respiratory: Negative for cough and chest tightness.   Gastrointestinal: Negative for nausea and abdominal pain.  Genitourinary: Negative for frequency, difficulty urinating and vaginal pain.  Musculoskeletal: Negative for back pain and gait problem.  Skin: Negative for pallor and rash.  Neurological: Negative for dizziness, tremors, weakness, numbness and headaches.  Psychiatric/Behavioral: Negative for confusion and sleep disturbance.       Objective:   Physical Exam  Constitutional: She appears well-developed. No distress.  Musculoskeletal:       L troch bursa area is tender to palp ROM ok LS spine is ok          Assessment & Plan:

## 2011-06-14 NOTE — Assessment & Plan Note (Signed)
X ray Mobic prn

## 2011-06-16 ENCOUNTER — Telehealth: Payer: Self-pay | Admitting: Internal Medicine

## 2011-06-16 NOTE — Telephone Encounter (Signed)
Tammy Barr, please, inform patient that her hip xray was nl Thx

## 2011-06-17 NOTE — Telephone Encounter (Signed)
Pt informed

## 2011-06-20 ENCOUNTER — Emergency Department (INDEPENDENT_AMBULATORY_CARE_PROVIDER_SITE_OTHER)
Admission: EM | Admit: 2011-06-20 | Discharge: 2011-06-20 | Disposition: A | Discharge status: Home / Self Care | Payer: PRIVATE HEALTH INSURANCE | Source: Home / Self Care | Attending: Emergency Medicine | Admitting: Emergency Medicine

## 2011-06-20 ENCOUNTER — Encounter (HOSPITAL_COMMUNITY): Payer: Self-pay | Admitting: *Deleted

## 2011-06-20 ENCOUNTER — Encounter: Payer: Self-pay | Admitting: Internal Medicine

## 2011-06-20 ENCOUNTER — Emergency Department (INDEPENDENT_AMBULATORY_CARE_PROVIDER_SITE_OTHER): Payer: PRIVATE HEALTH INSURANCE

## 2011-06-20 DIAGNOSIS — S60219A Contusion of unspecified wrist, initial encounter: Secondary | ICD-10-CM

## 2011-06-20 MED ORDER — HYDROCODONE-ACETAMINOPHEN 5-325 MG PO TABS: 2.0000 | ORAL_TABLET | ORAL | Status: AC | PRN
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
## 2011-06-20 NOTE — ED Notes (Signed)
pT  ALLEDGES  HER  R  WRIST  WAS   TWISTED   TODAY  BY  SIGNIFICANT  OTHER  SHE  HAS  SOME  BRUISING  SOME  SWELLING AND  PAIN ON PALPATION  DENYS  ANY  OTHER  INJURYS

## 2011-06-24 ENCOUNTER — Other Ambulatory Visit (INDEPENDENT_AMBULATORY_CARE_PROVIDER_SITE_OTHER): Payer: PRIVATE HEALTH INSURANCE

## 2011-06-24 ENCOUNTER — Telehealth: Payer: Self-pay | Admitting: Internal Medicine

## 2011-06-24 DIAGNOSIS — Z0000 Encounter for general adult medical examination without abnormal findings: Secondary | ICD-10-CM

## 2011-06-24 DIAGNOSIS — F1921 Other psychoactive substance dependence, in remission: Secondary | ICD-10-CM

## 2011-06-24 DIAGNOSIS — F17200 Nicotine dependence, unspecified, uncomplicated: Secondary | ICD-10-CM

## 2011-06-24 DIAGNOSIS — F1020 Alcohol dependence, uncomplicated: Secondary | ICD-10-CM

## 2011-06-24 DIAGNOSIS — Z720 Tobacco use: Secondary | ICD-10-CM

## 2011-06-24 LAB — LIPID PANEL
Cholesterol: 189 mg/dL (ref 0–200)
HDL: 48 mg/dL (ref >39.00)
VLDL: 25.2 mg/dL (ref 0.0–40.0)

## 2011-06-24 LAB — BASIC METABOLIC PANEL
Calcium: 9.2 mg/dL (ref 8.4–10.5)
GFR: 103.7 mL/min (ref >60.00)
Glucose, Bld: 103 mg/dL — ABNORMAL HIGH (ref 70–99)
Potassium: 3.9 mEq/L (ref 3.5–5.1)
Sodium: 142 mEq/L (ref 135–145)

## 2011-06-24 LAB — URINALYSIS
Bilirubin Urine: NEGATIVE
Leukocytes, UA: NEGATIVE
Nitrite: NEGATIVE
Total Protein, Urine: NEGATIVE
pH: 6 (ref 5.0–8.0)

## 2011-06-24 LAB — CBC WITH DIFFERENTIAL/PLATELET
Eosinophils Absolute: 0.3 10*3/uL (ref 0.0–0.7)
Eosinophils Relative: 3.4 % (ref 0.0–5.0)
HCT: 44.6 % (ref 36.0–46.0)
Lymphs Abs: 3.7 10*3/uL (ref 0.7–4.0)
MCHC: 33.5 g/dL (ref 30.0–36.0)
MCV: 88 fl (ref 78.0–100.0)
Monocytes Absolute: 0.7 10*3/uL (ref 0.1–1.0)
Neutrophils Relative %: 50.5 % (ref 43.0–77.0)
Platelets: 281 10*3/uL (ref 150.0–400.0)
RDW: 13.7 % (ref 11.5–14.6)

## 2011-06-24 LAB — HEPATIC FUNCTION PANEL
ALT: 25 U/L (ref 0–35)
AST: 23 U/L (ref 0–37)
Bilirubin, Direct: 0.1 mg/dL (ref 0.0–0.3)
Total Bilirubin: 0.5 mg/dL (ref 0.3–1.2)
Total Protein: 6.9 g/dL (ref 6.0–8.3)

## 2011-06-24 LAB — TSH: TSH: 2.02 u[IU]/mL (ref 0.35–5.50)

## 2011-06-24 NOTE — Telephone Encounter (Signed)
Stacey, please, inform patient that all labs are ok Thx 

## 2011-06-25 NOTE — Telephone Encounter (Signed)
Pt informed

## 2011-09-04 ENCOUNTER — Ambulatory Visit (INDEPENDENT_AMBULATORY_CARE_PROVIDER_SITE_OTHER): Payer: PRIVATE HEALTH INSURANCE | Admitting: Internal Medicine

## 2011-09-04 ENCOUNTER — Encounter: Payer: Self-pay | Admitting: Internal Medicine

## 2011-09-04 VITALS — BP 140/98 | HR 80 | Temp 99.2°F | Resp 16 | Wt 172.0 lb

## 2011-09-04 DIAGNOSIS — J309 Allergic rhinitis, unspecified: Secondary | ICD-10-CM

## 2011-09-04 DIAGNOSIS — J069 Acute upper respiratory infection, unspecified: Secondary | ICD-10-CM

## 2011-09-04 MED ORDER — LORATADINE 10 MG PO TABS: 10.0000 mg | ORAL_TABLET | Freq: Every day | ORAL | Status: DC

## 2011-09-04 MED ORDER — AZELASTINE-FLUTICASONE 137-50 MCG/ACT NA SUSP: 1.0000 | NASAL | Status: DC

## 2011-09-04 NOTE — Assessment & Plan Note (Signed)
See meds 

## 2011-09-04 NOTE — Patient Instructions (Signed)

## 2011-09-04 NOTE — Progress Notes (Signed)
  Subjective:    Patient ID: Tammy Barr, female    DOB: Feb 17, 1961, 51 y.o.   MRN: [id number]  HPI   HPI  C/o URI sx's x 1-2  days. C/o ST, cough, weakness. Not better with OTC medicines. Actually, the patient is getting worse.   Review of Systems  Constitutional: Positive for fever, chills and fatigue.  HENT: Positive for congestion, rhinorrhea, sneezing and postnasal drip.   Eyes: Positive for photophobia and pain. Negative for discharge and visual disturbance.  Respiratory: Positive for cough and wheezing.      Gastrointestinal: Negative for vomiting, abdominal pain, diarrhea and abdominal distention.  Genitourinary: Negative for dysuria and difficulty urinating.  Skin: Negative for rash.  Neurological: Positive for dizziness, weakness and light-headedness.      Review of Systems     Objective:   Physical Exam  Constitutional: She appears well-developed. No distress.  HENT:  Mouth/Throat: No oropharyngeal exudate.       eryth nasal mucosa, clear d/c  Eyes:       Eyes irritated  Cardiovascular: Normal rate and regular rhythm.   No murmur heard. Pulmonary/Chest: Breath sounds normal. No respiratory distress. She has no wheezes. She has no rales.  Abdominal: She exhibits no distension. There is no tenderness.  Skin: No rash noted. She is not diaphoretic.          Assessment & Plan:

## 2011-09-04 NOTE — Assessment & Plan Note (Signed)
Declined Depomedrol Dymista Loratidine

## 2011-10-02 ENCOUNTER — Encounter (HOSPITAL_COMMUNITY): Payer: Self-pay | Admitting: Emergency Medicine

## 2011-10-02 ENCOUNTER — Emergency Department (HOSPITAL_COMMUNITY)
Admission: EM | Admit: 2011-10-02 | Discharge: 2011-10-02 | Disposition: A | Discharge status: Home / Self Care | Payer: PRIVATE HEALTH INSURANCE | Attending: Emergency Medicine | Admitting: Emergency Medicine

## 2011-10-02 DIAGNOSIS — F429 Obsessive-compulsive disorder, unspecified: Secondary | ICD-10-CM | POA: Insufficient documentation

## 2011-10-02 DIAGNOSIS — M549 Dorsalgia, unspecified: Secondary | ICD-10-CM | POA: Insufficient documentation

## 2011-10-02 DIAGNOSIS — E785 Hyperlipidemia, unspecified: Secondary | ICD-10-CM | POA: Insufficient documentation

## 2011-10-02 DIAGNOSIS — E119 Type 2 diabetes mellitus without complications: Secondary | ICD-10-CM | POA: Insufficient documentation

## 2011-10-02 DIAGNOSIS — F319 Bipolar disorder, unspecified: Secondary | ICD-10-CM | POA: Insufficient documentation

## 2011-10-02 DIAGNOSIS — F341 Dysthymic disorder: Secondary | ICD-10-CM | POA: Insufficient documentation

## 2011-10-02 MED ORDER — METHOCARBAMOL 500 MG PO TABS: 500.0000 mg | ORAL_TABLET | Freq: Two times a day (BID) | ORAL | Status: AC

## 2011-10-02 MED ORDER — TRAMADOL HCL 50 MG PO TABS: 50.0000 mg | ORAL_TABLET | Freq: Four times a day (QID) | ORAL | Status: AC | PRN

## 2011-10-02 NOTE — ED Provider Notes (Signed)
Medical screening examination/treatment/procedure(s) were performed by non-physician practitioner and as supervising physician I was immediately available for consultation/collaboration.  Glenice Ciccone L Dennise Raabe, MD 10/02/11 2125 

## 2011-10-02 NOTE — ED Notes (Signed)
Back started hurting yesterday while at work, after doing repetitive motion, has tingling "up and down spine" starts between shoulder blades- works at [employer] , was putting papers in bags.

## 2011-10-02 NOTE — ED Provider Notes (Signed)
History     CSN: [id number]  Arrival date & time 10/02/11  6578   First MD Initiated Contact with Patient 10/02/11 1002      Chief Complaint  Patient presents with  . Back Pain    (Consider location/radiation/quality/duration/timing/severity/associated sxs/prior treatment) Patient is a 51 y.o. female presenting with back pain. The history is provided by the patient.  Back Pain  This is a new problem. The problem occurs constantly. The problem has not changed since onset.The pain is present in the thoracic spine. The pain is at a severity of 5/10. The pain is mild. Pertinent negatives include no chest pain, no fever, no numbness, no abdominal pain, no dysuria and no weakness.  Pt states since yesterday, she has had pain in the middle of her back, between shoulder blades. 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 States noted that after doing that yesterday, pain began. Today when went to work, pain started again when she was working. Denies numbness or weakness in arms or legs. Pain worsened with movement of arms.  Past Medical History  Diagnosis Date  . Anxiety   . Depression   . OCD (obsessive compulsive disorder)   . Bipolar 1 disorder   . Diabetes mellitus type II   . Hyperlipidemia   . Polysubstance abuse     hx of  . Tobacco abuse   . Polysubstance abuse     remote  . Arthritis     Past Surgical History  Procedure Date  . Dilation and curettage of uterus   . Tubal ligation   . Tonsillectomy     Family History  Problem Relation Age of Onset  . Diabetes Other     History  Substance Use Topics  . Smoking status: Current Everyday Smoker  . Smokeless tobacco: Not on file  . Alcohol Use: Yes     former ETOH abuse. now social drinker, completed treatment program    OB History    Grav Para Term Preterm Abortions TAB SAB Ect Mult Living                    Review of Systems  Constitutional: Negative for fever and chills.  Respiratory: Negative for cough, chest tightness and shortness of breath.   Cardiovascular: Negative for chest pain and leg swelling.  Gastrointestinal: Negative for abdominal pain.  Genitourinary: Negative for dysuria and flank pain.  Musculoskeletal: Positive for back pain.  Skin: Negative.   Neurological: Negative for weakness and numbness.  Psychiatric/Behavioral: Negative.     Allergies  Ibuprofen  Home Medications   Current Outpatient Rx  Name Route Sig Dispense Refill  . LITHIUM CARBONATE 300 MG PO TABS Oral Take 300 mg by mouth 2 (two) times daily.      Marland Kitchen PAROXETINE HCL 20 MG PO TABS Oral Take 20 mg by mouth every morning.        BP 152/93  Pulse 68  Temp(Src) 98.7 F (37.1 C) (Oral)  Resp 17  SpO2 100%  Physical Exam  Nursing note and vitals reviewed. Constitutional: She is oriented to person, place, and time. She appears well-developed and well-nourished. No distress.  HENT:  Head: Normocephalic.  Eyes: Conjunctivae are normal.  Neck: Neck supple.  Cardiovascular: Normal rate, regular rhythm and normal heart sounds.   Pulmonary/Chest: Effort normal and breath sounds normal. No respiratory distress. She has  no wheezes. She has no rales.  Abdominal: Soft. Bowel sounds are normal. She exhibits no distension. There is no tenderness.  Musculoskeletal:       Normal appearing back. Paravertebral tenderness bilaterally. No midline tenderness. Full rom of bilateral shoulders. Pain with shoulder abduction against resistance bilaterally  Neurological: She is alert and oriented to person, place, and time.  Skin: Skin is warm and dry.  Psychiatric: She has a normal mood and affect.    ED Course  Procedures (including critical care time)  Suspect pain musculoskeletal from her job. Will treat with muscle relaxants and follow up. Pt has no neuro deficits. No red flags to suggest cauda equina or  spinal cord compression. No abdominal pf flank pain. VS normal. No fever. No cough, doubt pneumonia or lung pathology.  1. Back pain       MDM          Lottie Mussel, PA 10/02/11 1607

## 2011-10-02 NOTE — Discharge Instructions (Signed)
Avoid lifting, pushing, or pulling anything heavier than 5lb with your arms. Heating pad. Tylenol for pain. Ultram for severe pain as prescribed. Robaxin for spasms. Follow up with your doctor in 3 days for recheck.  Back Pain, Adult Low back pain is very common. About 1 in 5 people have back pain.The cause of low back pain is rarely dangerous. The pain often gets better over time.About half of people with a sudden onset of back pain feel better in just 2 weeks. About 8 in 10 people feel better by 6 weeks.  CAUSES Some common causes of back pain include:  Strain of the muscles or ligaments supporting the spine.   Wear and tear (degeneration) of the spinal discs.   Arthritis.   Direct injury to the back.  DIAGNOSIS Most of the time, the direct cause of low back pain is not known.However, back pain can be treated effectively even when the exact cause of the pain is unknown.Answering your caregiver's questions about your overall health and symptoms is one of the most accurate ways to make sure the cause of your pain is not dangerous. If your caregiver needs more information, he or she may order lab work or imaging tests (X-rays or MRIs).However, even if imaging tests show changes in your back, this usually does not require surgery. HOME CARE INSTRUCTIONS For many people, back pain returns.Since low back pain is rarely dangerous, it is often a condition that people can learn to [facility] their own.   Remain active. It is stressful on the back to sit or stand in one place. Do not sit, drive, or stand in one place for more than 30 minutes at a time. Take short walks on level surfaces as soon as pain allows.Try to increase the length of time you walk each day.   Do not stay in bed.Resting more than 1 or 2 days can delay your recovery.   Do not avoid exercise or work.Your body is made to move.It is not dangerous to be active, even though your back may hurt.Your back will likely heal faster  if you return to being active before your pain is gone.   Pay attention to your body when you bend and lift. Many people have less discomfortwhen lifting if they bend their knees, keep the load close to their bodies,and avoid twisting. Often, the most comfortable positions are those that put less stress on your recovering back.   Find a comfortable position to sleep. Use a firm mattress and lie on your side with your knees slightly bent. If you lie on your back, put a pillow under your knees.   Only take over-the-counter or prescription medicines as directed by your caregiver. Over-the-counter medicines to reduce pain and inflammation are often the most helpful.Your caregiver may prescribe muscle relaxant drugs.These medicines help dull your pain so you can more quickly return to your normal activities and healthy exercise.   Put ice on the injured area.   Put ice in a plastic bag.   Place a towel between your skin and the bag.   Leave the ice on for 15 to 20 minutes, 3 to 4 times a day for the first 2 to 3 days. After that, ice and heat may be alternated to reduce pain and spasms.   Ask your caregiver about trying back exercises and gentle massage. This may be of some benefit.   Avoid feeling anxious or stressed.Stress increases muscle tension and can worsen back pain.It is important to recognize  when you are anxious or stressed and learn ways to manage it.Exercise is a great option.  SEEK MEDICAL CARE IF:  You have pain that is not relieved with rest or medicine.   You have pain that does not improve in 1 week.   You have new symptoms.   You are generally not feeling well.  SEEK IMMEDIATE MEDICAL CARE IF:   You have pain that radiates from your back into your legs.   You develop new bowel or bladder control problems.   You have unusual weakness or numbness in your arms or legs.   You develop nausea or vomiting.   You develop abdominal pain.   You feel faint.    Document Released: 05/13/2005 Document Revised: 05/02/2011 Document Reviewed: 10/01/2010 [facility] Patient Information 2012 [place], [place].

## 2011-10-02 NOTE — ED Notes (Signed)
Pt angry because she was not given something for pain other than Tramadol. PA back in to talk to her. Pt walked out without signing for d/c instrucitons.

## 2011-10-03 ENCOUNTER — Ambulatory Visit (INDEPENDENT_AMBULATORY_CARE_PROVIDER_SITE_OTHER): Payer: PRIVATE HEALTH INSURANCE | Admitting: Endocrinology

## 2011-10-03 ENCOUNTER — Encounter: Payer: Self-pay | Admitting: Endocrinology

## 2011-10-03 VITALS — BP 128/84 | HR 78 | Temp 98.8°F

## 2011-10-03 DIAGNOSIS — M545 Low back pain, unspecified: Secondary | ICD-10-CM

## 2011-10-03 NOTE — Progress Notes (Signed)
  Subjective:    Patient ID: Tammy Barr, female    DOB: 01-21-1961, 51 y.o.   MRN: [id number]  HPI Pt states 1 day of moderate pain at the mid and lower back, which started in the context of bending over.  No assoc numbness.  Pain is improved today.   Past Medical History  Diagnosis Date  . Anxiety   . Depression   . OCD (obsessive compulsive disorder)   . Bipolar 1 disorder   . Diabetes mellitus type II   . Hyperlipidemia   . Polysubstance abuse     hx of  . Tobacco abuse   . Polysubstance abuse     remote  . Arthritis     Past Surgical History  Procedure Date  . Dilation and curettage of uterus   . Tubal ligation   . Tonsillectomy     History   Social History  . Marital Status: Married    Spouse Name: N/A    Number of Children: N/A  . Years of Education: N/A   Occupational History  . Not on file.   Social History Main Topics  . Smoking status: Current Everyday Smoker  . Smokeless tobacco: Not on file  . Alcohol Use: Yes     former ETOH abuse. now social drinker, completed treatment program  . Drug Use: No  . Sexually Active: Not on file   Other Topics Concern  . Not on file   Social History Narrative   Regular Exercise- yes    Current Outpatient Prescriptions on File Prior to Visit  Medication Sig Dispense Refill  . lithium 300 MG tablet Take 300 mg by mouth 2 (two) times daily.        . methocarbamol (ROBAXIN) 500 MG tablet Take 1 tablet (500 mg total) by mouth 2 (two) times daily.  20 tablet  0  . PARoxetine (PAXIL) 20 MG tablet Take 20 mg by mouth every morning.        . traMADol (ULTRAM) 50 MG tablet Take 1 tablet (50 mg total) by mouth every 6 (six) hours as needed for pain.  15 tablet  0    Allergies  Allergen Reactions  . Ibuprofen Itching and Nausea Only    Family History  Problem Relation Age of Onset  . Diabetes Other     BP 128/84  Pulse 78  Temp(Src) 98.8 F (37.1 C) (Oral)  SpO2 96%    Review of Systems Denies bowel or  bladder retention.  She also has pain at the right leg.    Objective:   Physical Exam VITAL SIGNS:  See vs page GENERAL: no distress Spine: nontender Gait: normal and steady. Neuro: sensation and motor function are intact to touch on the LE's     Assessment & Plan:  Low-back pain, recurrent.

## 2011-10-03 NOTE — Patient Instructions (Signed)
Rest for a few days. I hope you feel better soon.  If you don't feel better by next week, please call dr plotnikov.

## 2011-12-24 ENCOUNTER — Encounter: Payer: Self-pay | Admitting: Internal Medicine

## 2011-12-24 ENCOUNTER — Ambulatory Visit (INDEPENDENT_AMBULATORY_CARE_PROVIDER_SITE_OTHER): Payer: PRIVATE HEALTH INSURANCE | Admitting: Internal Medicine

## 2011-12-24 VITALS — BP 130/80 | HR 80 | Temp 99.1°F | Resp 16 | Wt 168.0 lb

## 2011-12-24 DIAGNOSIS — M545 Low back pain, unspecified: Secondary | ICD-10-CM

## 2011-12-24 DIAGNOSIS — F329 Major depressive disorder, single episode, unspecified: Secondary | ICD-10-CM

## 2011-12-24 DIAGNOSIS — F3289 Other specified depressive episodes: Secondary | ICD-10-CM

## 2011-12-24 DIAGNOSIS — S01409A Unspecified open wound of unspecified cheek and temporomandibular area, initial encounter: Secondary | ICD-10-CM

## 2011-12-24 DIAGNOSIS — M2669 Other specified disorders of temporomandibular joint: Secondary | ICD-10-CM

## 2011-12-24 DIAGNOSIS — S01459A Open bite of unspecified cheek and temporomandibular area, initial encounter: Secondary | ICD-10-CM

## 2011-12-24 DIAGNOSIS — K130 Diseases of lips: Secondary | ICD-10-CM

## 2011-12-24 DIAGNOSIS — M26649 Arthritis of unspecified temporomandibular joint: Secondary | ICD-10-CM

## 2011-12-24 MED ORDER — TRIAMCINOLONE ACETONIDE 0.1 % MT PSTE: PASTE | Freq: Three times a day (TID) | OROMUCOSAL | Status: DC

## 2011-12-24 MED ORDER — AMOXICILLIN 500 MG PO CAPS: 1000.0000 mg | ORAL_CAPSULE | Freq: Two times a day (BID) | ORAL | Status: AC

## 2011-12-24 MED ORDER — VITAMIN D 1000 UNITS PO TABS: 1000.0000 [IU] | ORAL_TABLET | Freq: Every day | ORAL | Status: DC

## 2011-12-24 MED ORDER — CLOTRIMAZOLE-BETAMETHASONE 1-0.05 % EX CREA: TOPICAL_CREAM | Freq: Four times a day (QID) | CUTANEOUS | Status: DC

## 2011-12-24 NOTE — Patient Instructions (Signed)
Contour pillow  

## 2011-12-24 NOTE — Assessment & Plan Note (Signed)
Ibuprofen prn 

## 2011-12-24 NOTE — Assessment & Plan Note (Signed)
Kenalog in orabase Abx

## 2011-12-24 NOTE — Assessment & Plan Note (Signed)
Continue with current prescription therapy as reflected on the Med list.  

## 2011-12-24 NOTE — Assessment & Plan Note (Signed)
Lotrisone bid  

## 2011-12-24 NOTE — Assessment & Plan Note (Signed)
Better  

## 2011-12-24 NOTE — Progress Notes (Signed)
Subjective:    Patient ID: Tammy Barr, female    DOB: 11/25/1960, 51 y.o.   MRN: [id number]  HPI Comments: She has a sore in the L angle of the moth, a bite ulcer in the inner L cheek  Mouth Lesions  The current episode started more than 2 weeks ago. The onset was gradual. The problem occurs occasionally. The problem has been gradually worsening. The problem is moderate. The symptoms are aggravated by eating and movement. Associated symptoms include mouth sores and neck pain. Pertinent negatives include no fever, no decreased vision, no abdominal pain, no nausea, no congestion, no headaches, no cough and no rash.    Wt Readings from Last 3 Encounters:  12/24/11 168 lb (76.204 kg)  09/04/11 172 lb (78.019 kg)  06/10/11 174 lb (78.926 kg)   BP Readings from Last 3 Encounters:  12/24/11 130/80  10/03/11 128/84  10/02/11 152/93       Review of Systems  Constitutional: Negative for fever, chills, activity change, appetite change, fatigue and unexpected weight change.  HENT: Positive for mouth sores and neck pain. Negative for congestion, trouble swallowing and sinus pressure.   Eyes: Negative for visual disturbance.  Respiratory: Negative for cough and chest tightness.   Gastrointestinal: Negative for nausea and abdominal pain.  Genitourinary: Negative for frequency, difficulty urinating and vaginal pain.  Musculoskeletal: Negative for back pain and gait problem.  Skin: Negative for pallor and rash.  Neurological: Negative for dizziness, tremors, weakness, numbness and headaches.  Psychiatric/Behavioral: Negative for confusion and disturbed wake/sleep cycle.       Objective:   Physical Exam  Constitutional: She appears well-developed and well-nourished. No distress.  HENT:  Head: Normocephalic.  Right Ear: External ear normal.  Left Ear: External ear normal.  Nose: Nose normal.  Mouth/Throat: No oropharyngeal exudate.       Angular stomatitis L L 6 mm inner cheek bite  ulcer  Eyes: Conjunctivae are normal. Pupils are equal, round, and reactive to light. Right eye exhibits no discharge. Left eye exhibits no discharge.  Neck: Normal range of motion. Neck supple. No JVD present. No tracheal deviation present. No thyromegaly present.  Cardiovascular: Normal rate, regular rhythm and normal heart sounds.   Pulmonary/Chest: No stridor. No respiratory distress. She has no wheezes.  Abdominal: Soft. Bowel sounds are normal. She exhibits no distension and no mass. There is no tenderness. There is no rebound and no guarding.  Musculoskeletal: She exhibits no edema and no tenderness.       L troch bursa area is less tender to palp   Lymphadenopathy:    She has no cervical adenopathy.  Neurological: She displays normal reflexes. No cranial nerve deficit. She exhibits normal muscle tone. Coordination normal.  Skin: No rash noted. No erythema.  Psychiatric: She has a normal mood and affect. Her behavior is normal. Judgment and thought content normal.  L TMJ is tender  Lab Results  Component Value Date   WBC 9.6 06/24/2011   HGB 14.9 06/24/2011   HCT 44.6 06/24/2011   PLT 281.0 06/24/2011   GLUCOSE 103* 06/24/2011   CHOL 189 06/24/2011   TRIG 126.0 06/24/2011   HDL 48.00 06/24/2011   LDLCALC 116* 06/24/2011   ALT 25 06/24/2011   AST 23 06/24/2011   NA 142 06/24/2011   K 3.9 06/24/2011   CL 107 06/24/2011   CREATININE 0.6 06/24/2011   BUN 11 06/24/2011   CO2 27 06/24/2011   TSH 2.02 06/24/2011   HGBA1C  5.6 03/26/2010   MICROALBUR 0.50 10/04/2009          Assessment & Plan:

## 2012-01-02 ENCOUNTER — Telehealth: Payer: Self-pay | Admitting: Internal Medicine

## 2012-01-02 NOTE — Telephone Encounter (Signed)
Caller: Tammy Barr/Patient; PCP: Plotnikov, Alex; CB#: [phone]; ; ; Call regarding Diarrhea; Onset- 2 weeks per pt. Afebrile. Pt is having diarrhea about twice a day. She just finished antibiotics two days ago for sinus infection and MRSA. Emergent s/s of Diarrhea protocol r/o. Pt to see provider within 24hrs. Prt states she will call back tomorrow to schedule.

## 2012-01-22 ENCOUNTER — Encounter (INDEPENDENT_AMBULATORY_CARE_PROVIDER_SITE_OTHER): Payer: Self-pay | Admitting: General Surgery

## 2012-01-22 ENCOUNTER — Ambulatory Visit (INDEPENDENT_AMBULATORY_CARE_PROVIDER_SITE_OTHER): Payer: PRIVATE HEALTH INSURANCE | Admitting: General Surgery

## 2012-01-22 VITALS — BP 124/90 | HR 64 | Temp 97.4°F | Ht 67.0 in | Wt 167.2 lb

## 2012-01-22 DIAGNOSIS — N61 Mastitis without abscess: Secondary | ICD-10-CM

## 2012-01-22 DIAGNOSIS — N611 Abscess of the breast and nipple: Secondary | ICD-10-CM

## 2012-01-22 MED ORDER — DOXYCYCLINE HYCLATE 100 MG PO TABS: 100.0000 mg | ORAL_TABLET | Freq: Two times a day (BID) | ORAL | Status: AC

## 2012-01-22 MED ORDER — HYDROCODONE-ACETAMINOPHEN 5-325 MG PO TABS: 1.0000 | ORAL_TABLET | Freq: Four times a day (QID) | ORAL | Status: AC | PRN

## 2012-01-22 NOTE — Patient Instructions (Signed)
Start doxycycline today and take twice a day Call if redness worsens

## 2012-01-23 NOTE — Progress Notes (Signed)
Subjective:     Patient ID: Tammy Barr, female   DOB: 21-Oct-1960, 51 y.o.   MRN: [id number]  HPI We are asked to see the patient in consultation by Dr. Abigail Miyamoto to evaluate her for an abscess on her left breast. The patient is a 51 year old white female who has a history of multiple boils as well as drug abuse. For the last 3 days she has had a swollen and tender her left breast. She denies any fevers. She has not been placed on antibiotics as of yet. She states that the pain and redness is getting worse at the site.  Review of Systems  Constitutional: Negative.   HENT: Negative.   Eyes: Negative.   Respiratory: Negative.   Cardiovascular: Negative.   Gastrointestinal: Negative.   Genitourinary: Negative.   Musculoskeletal: Negative.   Neurological: Negative.   Hematological: Negative.   Psychiatric/Behavioral: Negative.        Objective:   Physical Exam  Constitutional: She is oriented to person, place, and time. She appears well-developed and well-nourished.  HENT:  Head: Normocephalic and atraumatic.  Eyes: Conjunctivae and EOM are normal. Pupils are equal, round, and reactive to light.  Neck: Normal range of motion. Neck supple.  Cardiovascular: Normal rate, regular rhythm and normal heart sounds.   Pulmonary/Chest: Effort normal and breath sounds normal.       There is redness and induration of the left breast in the 12:00 position. I probed the opening with a Q-tip and there does not appear to be a fluctuant cavity  Abdominal: Soft. Bowel sounds are normal.  Musculoskeletal: Normal range of motion.  Neurological: She is alert and oriented to person, place, and time.  Skin: Skin is warm and dry.  Psychiatric: She has a normal mood and affect. Her behavior is normal.       Assessment:     Cellulitis of the left breast    Plan:     At this point we will start her on doxycycline and see her back in several days to check her progress. If her condition worsens then she  may need to go to the emergency department for more urgent intervention. At this point there does not appear to be a fluctuant cavity to drain.

## 2012-02-04 ENCOUNTER — Encounter (INDEPENDENT_AMBULATORY_CARE_PROVIDER_SITE_OTHER): Payer: PRIVATE HEALTH INSURANCE | Admitting: General Surgery

## 2012-02-18 ENCOUNTER — Encounter (INDEPENDENT_AMBULATORY_CARE_PROVIDER_SITE_OTHER): Payer: PRIVATE HEALTH INSURANCE | Admitting: General Surgery

## 2012-03-05 ENCOUNTER — Telehealth: Payer: Self-pay | Admitting: Internal Medicine

## 2012-03-05 DIAGNOSIS — K121 Other forms of stomatitis: Secondary | ICD-10-CM

## 2012-03-05 NOTE — Telephone Encounter (Signed)
Pt wants a referral for the mouth sores. She is in a lot of pain in the mouth and neck.  The health department told her to get a referral.  The dentist didn't help.  They told her to stop the aclovor.  She is having problems being able to work.  Also affect her voice.

## 2012-03-05 NOTE — Telephone Encounter (Signed)
Ok oral surg ref Thx

## 2012-07-08 ENCOUNTER — Encounter (INDEPENDENT_AMBULATORY_CARE_PROVIDER_SITE_OTHER): Payer: PRIVATE HEALTH INSURANCE | Admitting: General Surgery

## 2012-07-15 ENCOUNTER — Ambulatory Visit (INDEPENDENT_AMBULATORY_CARE_PROVIDER_SITE_OTHER): Payer: BC Managed Care – PPO | Admitting: Surgery

## 2012-07-15 ENCOUNTER — Encounter (INDEPENDENT_AMBULATORY_CARE_PROVIDER_SITE_OTHER): Payer: Self-pay | Admitting: Surgery

## 2012-07-15 VITALS — BP 132/88 | HR 72 | Temp 97.5°F | Ht 67.0 in | Wt 172.6 lb

## 2012-07-15 DIAGNOSIS — IMO0002 Reserved for concepts with insufficient information to code with codable children: Secondary | ICD-10-CM

## 2012-07-15 DIAGNOSIS — L02411 Cutaneous abscess of right axilla: Secondary | ICD-10-CM

## 2012-07-15 MED ORDER — HYDROCODONE-ACETAMINOPHEN 5-325 MG PO TABS: 1.0000 | ORAL_TABLET | ORAL | Status: DC | PRN

## 2012-07-15 MED ORDER — DOXYCYCLINE HYCLATE 100 MG PO TABS: 100.0000 mg | ORAL_TABLET | Freq: Two times a day (BID) | ORAL | Status: DC

## 2012-07-15 NOTE — Progress Notes (Signed)
CC:  Chief Complaint  Patient presents with  . Abscess    Right axilla    HPI: She presents to the urgent office with an abscess developinig in the right axilla over the last few days. Has been feeling "bad" as well.  EXAM:  Vitals:BP 132/88  Pulse 72  Temp(Src) 97.5 F (36.4 C) (Temporal)  Ht 5\' 7"  (1.702 m)  Wt 172 lb 9.6 oz (78.291 kg)  BMI 27.03 kg/m2  SpO2 98% Gen: Alert, NAD Skin: Right axilla has three separate areas of fluctuance, largest about 1.5 cm, smallest 3mm.  IMP: Multiple superficial axiallry abscesses, may be hidradenitis developing   PLAN: Recommend I7D. Patient agrees.  Procedure Note: The area is anesthetised with 6cc 1% xylo with Epi. Then prepped.Made three small incisions and drained a tiny amount of purulent material. Packed with wicks Start antibiotic, doxycycline and Vicodin for pain

## 2012-07-15 NOTE — Patient Instructions (Addendum)
Change dressing twice daily. OK to shower. Start antibiotics and continue for ten days. If not completely healed see [place] again.  MRSA Overview MRSA stands for methicillin-resistant Staphylococcus aureus. It is a type of bacteria that is resistant to some common antibiotics. It can cause infections in the skin and many other places in the body. Staphylococcus aureus, often called "staph," is a bacteria that normally lives on the skin or in the nose. Staph on the surface of the skin or in the nose does not cause problems. However, if the staph enters the body through a cut, wound, or break in the skin, an infection can happen. Up until recently, infections with the MRSA type of staph mainly occurred in hospitals and other healthcare settings. There are now increasing problems with MRSA infections in the community as well. Infections with MRSA may be very serious or even life-threatening. Most MRSA infections are acquired in one of two ways:  Healthcare-associated MRSA (HA-MRSA)  This can be acquired by people in any healthcare setting. MRSA can be a big problem for hospitalized people, people in nursing homes, people in rehabilitation facilities, people with weakened immune systems, dialysis patients, and those who have had surgery.  Community-associated MRSA (CA-MRSA)  Community spread of MRSA is becoming more common. It is known to spread in crowded settings, in jails and prisons, and in situations where there is close skin-to-skin contact, such as during sporting events or in locker rooms. MRSA can be spread through shared items, such as children's toys, razors, towels, or sports equipment. CAUSES  All staph, including MRSA, are normally harmless unless they enter the body through a scratch, cut, or wound, such as with surgery. All staph, including MRSA, can be spread from person-to-person by touching contaminated objects or through direct contact. SPECIAL GROUPS MRSA can present problems for special  groups of people. Some of these groups include:  Breastfeeding women.  The most common problem is MRSA infection of the breast (mastitis). There is evidence that MRSA can be passed to an infant from infected breast milk. Your caregiver may recommend that you stop breastfeeding until the mastitis is under control.  If you are breastfeeding and have a MRSA infection in a place other than the breast, you may usually continue breastfeeding while under treatment. If taking antibiotics, ask your caregiver if it is safe to continue breastfeeding while taking your prescribed medicines.  Neonates (babies from birth to 34 month old) and infants (babies from 3 month to 64 year old).  There is evidence that MRSA can be passed to a newborn at birth if the mother has MRSA on the skin, in or around the birth canal, or an infection in the uterus, cervix, or vagina. MRSA infection can have the same appearance as a normal newborn or infant rash or several other skin infections. This can make it hard to diagnose MRSA.  Immune compromised people.  If you have an immune system problem, you may have a higher chance of developing a MRSA infection.  People after any type of surgery.  Staph in general, including MRSA, is the most common cause of infections occurring at the site of recent surgery.  People on long-term steroid medicines.  These kinds of medicines can lower your resistance to infection. This can increase your chance of getting MRSA.  People who have had frequent hospitalizations, live in nursing homes or other residential care facilities, have venous or urinary catheters, or have taken multiple courses of antibiotic therapy for any reason.  DIAGNOSIS  Diagnosis of MRSA is done by cultures of fluid samples that may come from:  Swabs taken from cuts or wounds in infected areas.  Nasal swabs.  Saliva or deep cough specimens from the lungs (sputum).  Urine.  Blood. Many people are "colonized" with  MRSA but have no signs of infection. This means that people carry the MRSA germ on their skin or in their nose and may never develop MRSA infection.  TREATMENT  Treatment varies and is based on how serious, how deep, or how extensive the infection is. For example:  Some skin infections, such as a small boil or abscess, may be treated by draining yellowish-white fluid (pus) from the site of the infection.  Deeper or more widespread soft tissue infections are usually treated with surgery to drain pus and with antibiotic medicine given by vein or by mouth. This may be recommended even if you are pregnant.  Serious infections may require a hospital stay. If antibiotics are given, they may be needed for several weeks. PREVENTION  Because many people are colonized with staph, including MRSA, preventing the spread of the bacteria from person-to-person is most important. The best way to prevent the spread of bacteria and other germs is through proper hand washing or by using alcohol-based hand disinfectants. The following are other ways to help prevent MRSA infection within the hospital and community settings.   Healthcare settings:  Strict hand washing or hand disinfection procedures need to be followed before and after touching every patient.  Patients infected with MRSA are placed in isolation to prevent the spread of the bacteria.  Healthcare workers need to wear disposable gowns and gloves when touching or caring for patients infected with MRSA. Visitors may also be asked to wear a gown and gloves.  Hospital surfaces need to be disinfected frequently.  Community settings:  NIKE frequently with soap and water for at least 15 seconds. Otherwise, use alcohol-based hand disinfectants when soap and water is not available.  Make sure people who live with you wash their hands often, too.  Do not share personal items. For example, avoid sharing razors and other personal hygiene items,  towels, clothing, and athletic equipment.  Wash and dry your clothes and bedding at the warmest temperatures recommended on the labels.  Keep wounds covered. Pus from infected sores may contain MRSA and other bacteria. Keep cuts and abrasions clean and covered with germ-free (sterile), dry bandages until they are healed.  If you have a wound that appears infected, ask your caregiver if a culture for MRSA and other bacteria should be done.  If you are breastfeeding, talk to your caregiver about MRSA. You may be asked to temporarily stop breastfeeding. HOME CARE INSTRUCTIONS   Take your antibiotics as directed. Finish them even if you start to feel better.  Avoid close contact with those around you as much as possible. Do not use towels, razors, toothbrushes, bedding, or other items that will be used by others.  To fight the infection, follow your caregiver's instructions for wound care. Wash your hands before and after changing your bandages.  If you have an intravascular device, such as a catheter, make sure you know how to care for it.  Be sure to tell any healthcare providers that you have MRSA so they are aware of your infection. SEEK IMMEDIATE MEDICAL CARE IF:   The infection appears to be getting worse. Signs include:  Increased warmth, redness, or tenderness around the wound site.  A red line that extends from the infection site.  A dark color in the area around the infection.  Wound drainage that is tan, yellow, or green.  A bad smell coming from the wound.  You feel sick to your stomach (nauseous) and throw up (vomit) or cannot keep medicine down.  You have a fever.  Your baby is older than 3 months with a rectal temperature of 102 F (38.9 C) or higher.  Your baby is 56 months old or younger with a rectal temperature of 100.4 F (38 C) or higher.  You have difficulty breathing. MAKE SURE YOU:   Understand these instructions.  Will watch your condition.  Will  get help right away if you are not doing well or get worse. Document Released: 05/13/2005 Document Revised: 08/05/2011 Document Reviewed: 08/15/2010 [facility] Patient Information 2013 [place], [place].

## 2012-07-17 ENCOUNTER — Telehealth (INDEPENDENT_AMBULATORY_CARE_PROVIDER_SITE_OTHER): Payer: Self-pay | Admitting: *Deleted

## 2012-07-17 NOTE — Telephone Encounter (Signed)
Patient called because she had forgotten her wound care instructions.  Patient instructed to wash with soap and water then pat dry.  Patient instructed to keep sites covered and change frequently to prevent infection.  Patient also instructed to stop "stuff the holes" with Neosporin.  Patient states understanding at this time.

## 2012-08-25 ENCOUNTER — Other Ambulatory Visit: Payer: Self-pay | Admitting: Oral Surgery

## 2012-08-25 DIAGNOSIS — R52 Pain, unspecified: Secondary | ICD-10-CM

## 2012-08-30 ENCOUNTER — Ambulatory Visit
Admission: RE | Admit: 2012-08-30 | Discharge: 2012-08-30 | Disposition: A | Discharge status: Home / Self Care | Payer: PRIVATE HEALTH INSURANCE | Source: Ambulatory Visit | Attending: Oral Surgery | Admitting: Oral Surgery

## 2012-08-30 DIAGNOSIS — R52 Pain, unspecified: Secondary | ICD-10-CM

## 2012-10-20 ENCOUNTER — Encounter: Payer: Self-pay | Admitting: Internal Medicine

## 2012-10-20 ENCOUNTER — Ambulatory Visit (INDEPENDENT_AMBULATORY_CARE_PROVIDER_SITE_OTHER): Payer: BC Managed Care – PPO | Admitting: Internal Medicine

## 2012-10-20 VITALS — BP 130/78 | HR 76 | Temp 97.9°F | Resp 16 | Ht 67.0 in | Wt 171.0 lb

## 2012-10-20 DIAGNOSIS — L84 Corns and callosities: Secondary | ICD-10-CM | POA: Insufficient documentation

## 2012-10-20 DIAGNOSIS — F329 Major depressive disorder, single episode, unspecified: Secondary | ICD-10-CM

## 2012-10-20 DIAGNOSIS — Z0000 Encounter for general adult medical examination without abnormal findings: Secondary | ICD-10-CM

## 2012-10-20 DIAGNOSIS — F411 Generalized anxiety disorder: Secondary | ICD-10-CM

## 2012-10-20 DIAGNOSIS — E785 Hyperlipidemia, unspecified: Secondary | ICD-10-CM

## 2012-10-20 DIAGNOSIS — E119 Type 2 diabetes mellitus without complications: Secondary | ICD-10-CM

## 2012-10-20 DIAGNOSIS — F429 Obsessive-compulsive disorder, unspecified: Secondary | ICD-10-CM

## 2012-10-20 DIAGNOSIS — F1020 Alcohol dependence, uncomplicated: Secondary | ICD-10-CM

## 2012-10-20 DIAGNOSIS — Z23 Encounter for immunization: Secondary | ICD-10-CM

## 2012-10-20 MED ORDER — VITAMIN D 1000 UNITS PO TABS: 1000.0000 [IU] | ORAL_TABLET | Freq: Every day | ORAL | Status: AC

## 2012-10-20 NOTE — Progress Notes (Signed)
  Subjective:    HPI  The patient is here for a wellness exam.   The patient has been doing well overall without major physical or psychological issues going on lately. She has 6 cats and 1 dog  The patient needs to address  chronic hypertension that has been well controlled with medicines; to address chronic  hyperlipidemia controlled with medicines as well; and to address elev glucose, controlled with medical treatment and diet.   F/u L hip OA - severe pain  She was unable to d/c smoking   Review of Systems  Constitutional: Negative for fever, chills, diaphoresis, activity change, appetite change, fatigue and unexpected weight change.  HENT: Negative for hearing loss, ear pain, congestion, sore throat, sneezing, mouth sores, neck pain, dental problem, voice change, postnasal drip and sinus pressure.   Eyes: Negative for pain and visual disturbance.  Respiratory: Negative for cough, chest tightness, wheezing and stridor.   Cardiovascular: Negative for chest pain, palpitations and leg swelling.  Gastrointestinal: Negative for nausea, vomiting, abdominal pain, blood in stool, abdominal distention and rectal pain.  Genitourinary: Negative for dysuria, hematuria, decreased urine volume, vaginal bleeding, vaginal discharge, difficulty urinating, vaginal pain and menstrual problem (menopausal).  Musculoskeletal: Negative for back pain, joint swelling and gait problem.  Skin: Negative for color change, rash and wound.  Neurological: Negative for dizziness, tremors, syncope, speech difficulty and light-headedness.  Hematological: Negative for adenopathy.  Psychiatric/Behavioral: Negative for suicidal ideas, hallucinations, behavioral problems, confusion, sleep disturbance, dysphoric mood and decreased concentration. The patient is not hyperactive.        Objective:   Physical Exam  Constitutional: She appears well-developed. No distress.  HENT:  Head: Normocephalic.  Right Ear: External  ear normal.  Left Ear: External ear normal.  Nose: Nose normal.  Mouth/Throat: Oropharynx is clear and moist.  Eyes: Conjunctivae are normal. Pupils are equal, round, and reactive to light. Right eye exhibits no discharge. Left eye exhibits no discharge.  Neck: Normal range of motion. Neck supple. No JVD present. No tracheal deviation present. No thyromegaly present.  Cardiovascular: Normal rate, regular rhythm and normal heart sounds.   Pulmonary/Chest: No stridor. No respiratory distress. She has no wheezes.  Abdominal: Soft. Bowel sounds are normal. She exhibits no distension and no mass. There is no tenderness. There is no rebound and no guarding.  Genitourinary: Vagina normal and uterus normal. Guaiac negative stool. No vaginal discharge found.  Musculoskeletal: She exhibits no edema and no tenderness.  Lymphadenopathy:    She has no cervical adenopathy.  Neurological: She displays normal reflexes. No cranial nerve deficit. She exhibits normal muscle tone. Coordination normal.  Skin: No rash noted. No erythema.  Psychiatric: She has a normal mood and affect. Her behavior is normal. Judgment and thought content normal.  R foot 3 cm callus - tender  Procedure:  R  foot callus paring/cutting  Indication:    foot callus, painful  Consent: verbal  Risks and benefits were explained to the patient. Skin was cleaned with alcohol. I removed a large callus carefully with a round blade. Skin remained intact. Pain is better. Tolerated well. Complications: none. Bandaid applied          Assessment & Plan:

## 2012-10-20 NOTE — Assessment & Plan Note (Signed)
A1c, labs Off metformin now

## 2012-10-20 NOTE — Assessment & Plan Note (Signed)
Cont Lithium  Re-start Paxil 

## 2012-10-20 NOTE — Assessment & Plan Note (Signed)
Discussed.

## 2012-10-20 NOTE — Assessment & Plan Note (Signed)
See procedure 

## 2012-10-20 NOTE — Assessment & Plan Note (Addendum)
Here for medicare wellness/physical  Diet: heart healthy  Physical activity: not sedentary  Depression/mood screen: anxious Hearing: intact to whispered voice  Visual acuity: grossly normal, performs annual eye exam  ADLs: capable  Fall risk: none  Home safety: good  Cognitive evaluation: intact to orientation, naming, recall and repetition  EOL planning: adv directives, full code/ I agree  I have personally reviewed and have noted  1. The patient's medical and social history  2. Their use of alcohol, tobacco or illicit drugs  3. Their current medications and supplements  4. The patient's functional ability including ADL's, fall risks, home safety risks and hearing or visual impairment.  5. Diet and physical activities  6. Evidence for depression or mood disorders    Today patient counseled on age appropriate routine health concerns for screening and prevention, each reviewed and up to date or declined. Immunizations reviewed and up to date or declined. Labs ordered and reviewed. Risk factors for depression reviewed and negative. Hearing function and visual acuity are intact. ADLs screened and addressed as needed. Functional ability and level of safety reviewed and appropriate. Education, counseling and referrals performed based on assessed risks today. Patient provided with a copy of personalized plan for preventive services.  GYN and mammo at [facility] dept q 12 mo Opth cons  12 mo

## 2012-10-20 NOTE — Assessment & Plan Note (Signed)
Cont Lithium  Re-start Paxil

## 2013-01-10 ENCOUNTER — Emergency Department (INDEPENDENT_AMBULATORY_CARE_PROVIDER_SITE_OTHER)
Admission: EM | Admit: 2013-01-10 | Discharge: 2013-01-10 | Disposition: A | Discharge status: Home / Self Care | Payer: BC Managed Care – PPO | Source: Home / Self Care

## 2013-01-10 ENCOUNTER — Encounter (HOSPITAL_COMMUNITY): Payer: Self-pay | Admitting: *Deleted

## 2013-01-10 DIAGNOSIS — K089 Disorder of teeth and supporting structures, unspecified: Secondary | ICD-10-CM

## 2013-01-10 DIAGNOSIS — K0889 Other specified disorders of teeth and supporting structures: Secondary | ICD-10-CM

## 2013-01-10 MED ORDER — AMOXICILLIN-POT CLAVULANATE 875-125 MG PO TABS: 1.0000 | ORAL_TABLET | Freq: Two times a day (BID) | ORAL | Status: DC

## 2013-01-10 MED ORDER — HYDROCODONE-ACETAMINOPHEN 5-325 MG PO TABS: 2.0000 | ORAL_TABLET | ORAL | Status: DC | PRN

## 2013-01-10 NOTE — ED Provider Notes (Signed)
Tammy Barr is a 52 y.o. female who presents to Urgent Care today for dental pain. Patient had a root canal of the upper right molar on August 13 with a temporary crown. She felt well until yesterday when she developed worsening pain and swelling at the gumline surrounding the crown. Her appointment with her dentist does not until 2 weeks from now. She has run out of the Norco that was prescribed by her dentist (6 tablets). She denies any fevers chills nausea vomiting or diarrhea. She feels well otherwise.    PMH reviewed. Bipolar disorder History  Substance Use Topics  . Smoking status: Former Smoker    Quit date: 01/21/2001  . Smokeless tobacco: Not on file  . Alcohol Use: No     Comment: former ETOH abuse. now social drinker, completed treatment program   ROS as above Medications reviewed. No current facility-administered medications for this encounter.   Current Outpatient Prescriptions  Medication Sig Dispense Refill  . lithium 300 MG tablet Take 300 mg by mouth 2 (two) times daily.        Marland Kitchen PARoxetine (PAXIL) 20 MG tablet Take 20 mg by mouth every morning.        Marland Kitchen amoxicillin-clavulanate (AUGMENTIN) 875-125 MG per tablet Take 1 tablet by mouth every 12 (twelve) hours.  28 tablet  0  . cholecalciferol (VITAMIN D) 1000 UNITS tablet Take 1 tablet (1,000 Units total) by mouth daily.  100 tablet  3  . Cyclobenzaprine HCl (FLEXERIL PO) Take 1 each by mouth daily.      Marland Kitchen HYDROcodone-acetaminophen (NORCO/VICODIN) 5-325 MG per tablet Take 2 tablets by mouth every 4 (four) hours as needed for pain.  15 tablet  0    Exam:  BP 137/93  Pulse 71  Temp(Src) 98.8 F (37.1 C) (Oral)  Resp 20  SpO2 100% Gen: Well NAD HEENT: EOMI,  MMM, temporary crown upper right molar with surrounding gum erythema and tenderness.  Lungs: CTABL Nl WOB Heart: RRR no MRG Abd: NABS, NT, ND Exts: Non edematous BL  LE, warm and well perfused.   No results found for this or any previous visit (from the past  24 hour(s)). No results found.  Assessment and Plan: 52 y.o. female with dental pain. Possibly infection of dental implant.  Plan to treat with Augmentin and small amount of Norco (15 tablet)  Followup with dentist as soon as possible Handout provided Discussed warning signs or symptoms. Please see discharge instructions. Patient expresses understanding.      Rodolph Bong, MD 01/10/13 1145

## 2013-01-10 NOTE — ED Notes (Signed)
Reports having dental work done right upper molar on 8/13; is to have crown placed in 2 wks.  Started with swelling, pain, warmth to area last night.  Took Vicodin.

## 2013-02-01 ENCOUNTER — Ambulatory Visit (INDEPENDENT_AMBULATORY_CARE_PROVIDER_SITE_OTHER): Payer: BC Managed Care – PPO | Admitting: General Surgery

## 2013-02-01 ENCOUNTER — Encounter (INDEPENDENT_AMBULATORY_CARE_PROVIDER_SITE_OTHER): Payer: Self-pay | Admitting: General Surgery

## 2013-02-01 VITALS — BP 130/92 | HR 59 | Temp 97.7°F | Ht 66.0 in | Wt 166.2 lb

## 2013-02-01 DIAGNOSIS — L02219 Cutaneous abscess of trunk, unspecified: Secondary | ICD-10-CM

## 2013-02-01 NOTE — Patient Instructions (Signed)
You had a small abscess in the midline in the pubic hair. This did not involve the vagina.  Incision and drainage of this small abscess was performed today.  There is a small piece of gauze packing the wound, and you should remove that tomorrow, throw it away, and do not repack the wound.  Take a warm tub bath or shower twice a day, and cover the area with clean bandage after that.  Take the antibiotics that I gave you until they are all gone.  It should completely heal in 2 weeks or so.  Return to Dr. Derrell Lolling if further problems arise.

## 2013-02-01 NOTE — Progress Notes (Signed)
Patient ID: Tammy Barr, female   DOB: June 29, 1960, 52 y.o.   MRN: [id number] History: This patient is self-referred and presents with a 2 day history of redness tenderness in the midline of the pubic hair. Her primary care physician is Dr. Trinna Post Plotnikov. She has significant comorbidities including diabetes, bipolar disorder, OCD, alcohol and drug abuse, tobacco abuse, and hypertension. She has never had an abscess in this location before. She states she has had a MRSA infection before.  Exam: Patient is alert. Cooperative. Abdomen is soft and nontender Skin: 1 cm abscess in the midline in the pubic hair above the vagina. This area was prepped with Betadine, anesthetized with 1% Xylocaine with epinephrine. Incision and drainage performed. Minimal purulence. Wound spread with  hemostat to be sure there were no other loculations. Iodoform gauze packing. Clean bandage. Tolerated well  Assessment: Small abscess, midline, pubic hair, drained in office today Polysubstance abuse Bipolar disorder Hypertension History MRSA in the past  Plan: Doxycycline 1 mg twice a day x10 days Vicodin 2 tablets only, and given the patient's request. Wound care discussed. Remove packing tomorrow. Twice a day sitz bath or shower. Dry bandage Return to see me this does not completely heal in 2 weeks.    Angelia Mould. Derrell Lolling, M.D., [facility] General and Minimally invasive Surgery Breast and Colorectal Surgery Office:   [phone] Pager:   [phone]

## 2013-02-22 ENCOUNTER — Other Ambulatory Visit: Payer: Self-pay | Admitting: *Deleted

## 2013-02-22 NOTE — Telephone Encounter (Signed)
Pt called requesting Tramadol refill.  Please advise 

## 2013-02-23 MED ORDER — TRAMADOL HCL 50 MG PO TABS: 50.0000 mg | ORAL_TABLET | Freq: Two times a day (BID) | ORAL | Status: DC | PRN

## 2013-02-23 NOTE — Telephone Encounter (Signed)
Ok Sch ov Thx

## 2013-03-30 ENCOUNTER — Encounter (HOSPITAL_COMMUNITY): Payer: Self-pay | Admitting: Emergency Medicine

## 2013-03-30 ENCOUNTER — Emergency Department (HOSPITAL_COMMUNITY)
Admission: EM | Admit: 2013-03-30 | Discharge: 2013-03-30 | Disposition: A | Discharge status: Home / Self Care | Payer: BC Managed Care – PPO | Source: Home / Self Care | Attending: Family Medicine | Admitting: Family Medicine

## 2013-03-30 DIAGNOSIS — H00013 Hordeolum externum right eye, unspecified eyelid: Secondary | ICD-10-CM

## 2013-03-30 DIAGNOSIS — H00019 Hordeolum externum unspecified eye, unspecified eyelid: Secondary | ICD-10-CM

## 2013-03-30 DIAGNOSIS — K047 Periapical abscess without sinus: Secondary | ICD-10-CM

## 2013-03-30 MED ORDER — ACYCLOVIR 400 MG PO TABS: 400.0000 mg | ORAL_TABLET | Freq: Four times a day (QID) | ORAL | Status: DC

## 2013-03-30 MED ORDER — HYDROCODONE-ACETAMINOPHEN 5-325 MG PO TABS: 2.0000 | ORAL_TABLET | ORAL | Status: DC | PRN

## 2013-03-30 MED ORDER — TOBRAMYCIN 0.3 % OP SOLN: 2.0000 [drp] | OPHTHALMIC | Status: DC

## 2013-03-30 MED ORDER — AMOXICILLIN 500 MG PO CAPS: 500.0000 mg | ORAL_CAPSULE | Freq: Three times a day (TID) | ORAL | Status: DC

## 2013-03-30 NOTE — ED Provider Notes (Signed)
CSN: [id number]     Arrival date & time 03/30/13  9562 History   First MD Initiated Contact with Patient 03/30/13 [phone]     Chief Complaint  Patient presents with  . Dental Pain   (Consider location/radiation/quality/duration/timing/severity/associated sxs/prior Treatment) Patient is a 52 y.o. female presenting with tooth pain. The history is provided by the patient. No language interpreter was used.  Dental Pain Location:  Lower Lower teeth location:  20/LL 2nd bicuspid, 19/LL 1st molar and 18/LL 2nd molar Quality:  Aching Severity:  Moderate Duration:  1 month Timing:  Constant Context: abscess   Associated symptoms: oral lesions   Pt complains of oral ulcers, infected gum and a ste to left eyelid  Past Medical History  Diagnosis Date  . Anxiety   . Depression   . OCD (obsessive compulsive disorder)   . Bipolar 1 disorder   . Diabetes mellitus type II   . Hyperlipidemia   . Polysubstance abuse     hx of  . Tobacco abuse   . Polysubstance abuse     remote  . Arthritis    Past Surgical History  Procedure Laterality Date  . Dilation and curettage of uterus    . Tubal ligation    . Tonsillectomy     Family History  Problem Relation Age of Onset  . Diabetes Other   . Cancer Maternal Grandmother     breast   History  Substance Use Topics  . Smoking status: Former Smoker    Quit date: 01/21/2001  . Smokeless tobacco: Not on file  . Alcohol Use: No     Comment: former ETOH abuse. now social drinker, completed treatment program   OB History   Grav Para Term Preterm Abortions TAB SAB Ect Mult Living                 Review of Systems  HENT: Positive for dental problem and mouth sores.   All other systems reviewed and are negative.    Allergies  Ibuprofen  Home Medications   Current Outpatient Rx  Name  Route  Sig  Dispense  Refill  . lithium 300 MG tablet   Oral   Take 300 mg by mouth 2 (two) times daily.           Marland Kitchen PARoxetine (PAXIL) 20 MG  tablet   Oral   Take 20 mg by mouth every morning.           Marland Kitchen acyclovir (ZOVIRAX) 400 MG tablet   Oral   Take 1 tablet (400 mg total) by mouth 4 (four) times daily.   50 tablet   0   . amoxicillin (AMOXIL) 500 MG capsule   Oral   Take 1 capsule (500 mg total) by mouth 3 (three) times daily.   21 capsule   0   . cholecalciferol (VITAMIN D) 1000 UNITS tablet   Oral   Take 1 tablet (1,000 Units total) by mouth daily.   100 tablet   3   . Cyclobenzaprine HCl (FLEXERIL PO)   Oral   Take 1 each by mouth daily.         Marland Kitchen HYDROcodone-acetaminophen (NORCO/VICODIN) 5-325 MG per tablet   Oral   Take 2 tablets by mouth every 4 (four) hours as needed.   10 tablet   0   . tobramycin (TOBREX) 0.3 % ophthalmic solution   Left Eye   Place 2 drops into the left eye every 4 (four) hours.  5 mL   0   . traMADol (ULTRAM) 50 MG tablet   Oral   Take 1-2 tablets (50-100 mg total) by mouth 2 (two) times daily as needed for pain.   60 tablet   0    BP 132/94  Pulse 59  Temp(Src) 97.8 F (36.6 C) (Oral)  Resp 16  SpO2 98% Physical Exam  Nursing note and vitals reviewed. Constitutional: She is oriented to person, place, and time. She appears well-developed and well-nourished.  HENT:  Head: Normocephalic and atraumatic.  Nose: Nose normal.  Mouth/Throat: Oropharynx is clear and moist.  Ulcers mouth,   Swelling gum line left lower    Eyes: Conjunctivae are normal. Pupils are equal, round, and reactive to light.  Stye lower eyelid  Neck: Normal range of motion. Neck supple.  Cardiovascular: Normal rate.   Pulmonary/Chest: Effort normal.  Abdominal: Soft.  Musculoskeletal: Normal range of motion.  Neurological: She is alert and oriented to person, place, and time. She has normal reflexes.  Skin: Skin is warm.  Psychiatric: She has a normal mood and affect.    ED Course  Procedures (including critical care time) Labs Review Labs Reviewed - No data to display Imaging  Review No results found.  EKG Interpretation     Ventricular Rate:    PR Interval:    QRS Duration:   QT Interval:    QTC Calculation:   R Axis:     Text Interpretation:              MDM   1. Stye, right   2. Abscess, dental       Elson Areas, PA-C 03/30/13 1110

## 2013-03-30 NOTE — ED Provider Notes (Signed)
Medical screening examination/treatment/procedure(s) were performed by resident physician or non-physician practitioner and as supervising physician I was immediately available for consultation/collaboration.   KINDL,JAMES DOUGLAS MD.   James D Kindl, MD 03/30/13 1512 

## 2013-03-30 NOTE — ED Notes (Signed)
Pt is here for dental pain onset 1 month; lower left teeth Sxs include: swelling and fevers Also c/o stye on right lower eye lid onset 2 days and sore inside mouth  Denies: n/v/d Alert w/no signs of acute distress.

## 2013-03-31 ENCOUNTER — Ambulatory Visit (INDEPENDENT_AMBULATORY_CARE_PROVIDER_SITE_OTHER): Payer: BC Managed Care – PPO | Admitting: General Surgery

## 2013-03-31 ENCOUNTER — Encounter (INDEPENDENT_AMBULATORY_CARE_PROVIDER_SITE_OTHER): Payer: Self-pay | Admitting: General Surgery

## 2013-03-31 VITALS — BP 120/80 | HR 60 | Temp 99.3°F | Resp 14 | Ht 67.0 in | Wt 169.8 lb

## 2013-03-31 DIAGNOSIS — IMO0002 Reserved for concepts with insufficient information to code with codable children: Secondary | ICD-10-CM

## 2013-03-31 DIAGNOSIS — L03119 Cellulitis of unspecified part of limb: Secondary | ICD-10-CM

## 2013-03-31 MED ORDER — SULFAMETHOXAZOLE-TRIMETHOPRIM 400-80 MG PO TABS: 1.0000 | ORAL_TABLET | Freq: Two times a day (BID) | ORAL | Status: AC

## 2013-03-31 NOTE — Progress Notes (Signed)
Subjective:     Patient ID: Tammy Barr, female   DOB: 1961-02-29, 52 y.o.   MRN: [id number]  HPI The patient is a 52 year old female who self-referred for evaluation of axillary abscess. Patient was recently in the urgent care N. Forgot to bring them up there. she states she's had previous cellulitis near her groin area. She states she's recently diagnosed with MRSA.  Review of Systems  Constitutional: Negative.   HENT: Negative.   Respiratory: Negative.   Cardiovascular: Negative.   Gastrointestinal: Negative.   Neurological: Negative.   All other systems reviewed and are negative.       Objective:   Physical Exam  Constitutional: She is oriented to person, place, and time. She appears well-developed and well-nourished.  HENT:  Head: Normocephalic and atraumatic.  Eyes: Conjunctivae and EOM are normal. Pupils are equal, round, and reactive to light.  Neck: Normal range of motion. Neck supple.  Cardiovascular: Normal rate, regular rhythm and normal heart sounds.   Pulmonary/Chest: Effort normal and breath sounds normal.  Abdominal: Soft. Bowel sounds are normal.  Musculoskeletal: Normal range of motion.  Neurological: She is alert and oriented to person, place, and time.  Skin: Skin is warm and dry.          Assessment:     52 year old female with right axillary cellulitis.     Plan:     1. I will give the patient a Prescription for Bactrim this will take care of her cellulitis.  Should this become worse or become fluctuant she can come back to urgent clinic for I&D. 2. Patient is currently on amoxicillin for her right eye stye.

## 2013-04-01 ENCOUNTER — Other Ambulatory Visit: Payer: Self-pay

## 2014-03-11 ENCOUNTER — Other Ambulatory Visit: Payer: Self-pay

## 2014-07-11 ENCOUNTER — Ambulatory Visit: Payer: Self-pay

## 2014-08-04 ENCOUNTER — Ambulatory Visit: Payer: Self-pay | Attending: Family Medicine

## 2014-08-29 ENCOUNTER — Ambulatory Visit: Payer: Self-pay | Admitting: Internal Medicine

## 2015-03-07 ENCOUNTER — Ambulatory Visit: Payer: Self-pay

## 2015-03-07 ENCOUNTER — Encounter: Payer: Self-pay | Admitting: Internal Medicine

## 2015-03-07 ENCOUNTER — Ambulatory Visit (INDEPENDENT_AMBULATORY_CARE_PROVIDER_SITE_OTHER): Payer: Self-pay | Admitting: Internal Medicine

## 2015-03-07 VITALS — BP 163/89 | HR 66 | Temp 97.7°F | Ht 67.0 in | Wt 158.9 lb

## 2015-03-07 DIAGNOSIS — Z8679 Personal history of other diseases of the circulatory system: Secondary | ICD-10-CM

## 2015-03-07 DIAGNOSIS — Z0000 Encounter for general adult medical examination without abnormal findings: Secondary | ICD-10-CM | POA: Insufficient documentation

## 2015-03-07 DIAGNOSIS — F3176 Bipolar disorder, in full remission, most recent episode depressed: Secondary | ICD-10-CM

## 2015-03-07 DIAGNOSIS — M26649 Arthritis of unspecified temporomandibular joint: Secondary | ICD-10-CM

## 2015-03-07 DIAGNOSIS — M2550 Pain in unspecified joint: Secondary | ICD-10-CM

## 2015-03-07 DIAGNOSIS — R030 Elevated blood-pressure reading, without diagnosis of hypertension: Secondary | ICD-10-CM

## 2015-03-07 DIAGNOSIS — Z8639 Personal history of other endocrine, nutritional and metabolic disease: Secondary | ICD-10-CM

## 2015-03-07 DIAGNOSIS — Z79899 Other long term (current) drug therapy: Secondary | ICD-10-CM

## 2015-03-07 DIAGNOSIS — Z87891 Personal history of nicotine dependence: Secondary | ICD-10-CM

## 2015-03-07 DIAGNOSIS — Z8659 Personal history of other mental and behavioral disorders: Secondary | ICD-10-CM

## 2015-03-07 DIAGNOSIS — Z23 Encounter for immunization: Secondary | ICD-10-CM

## 2015-03-07 DIAGNOSIS — F411 Generalized anxiety disorder: Secondary | ICD-10-CM

## 2015-03-07 DIAGNOSIS — F1421 Cocaine dependence, in remission: Secondary | ICD-10-CM

## 2015-03-07 DIAGNOSIS — Z87828 Personal history of other (healed) physical injury and trauma: Secondary | ICD-10-CM | POA: Insufficient documentation

## 2015-03-07 DIAGNOSIS — M2669 Other specified disorders of temporomandibular joint: Secondary | ICD-10-CM

## 2015-03-07 DIAGNOSIS — E119 Type 2 diabetes mellitus without complications: Secondary | ICD-10-CM

## 2015-03-07 DIAGNOSIS — Z87898 Personal history of other specified conditions: Secondary | ICD-10-CM

## 2015-03-07 DIAGNOSIS — K130 Diseases of lips: Secondary | ICD-10-CM

## 2015-03-07 MED ORDER — MELOXICAM 7.5 MG PO TABS: 7.5000 mg | ORAL_TABLET | Freq: Every day | ORAL | Status: DC

## 2015-03-07 NOTE — Assessment & Plan Note (Signed)
BP 163/89 today. Review of prior BP values shows that she has been normotensive. Denies being on BP meds ever. Continue reassessing.

## 2015-03-07 NOTE — Assessment & Plan Note (Signed)
Reports intolerance to cholesterol medication in the past due to worsening of joint pain. Defer rechecking lipid panel until [facility].

## 2015-03-07 NOTE — Assessment & Plan Note (Addendum)
Smoked 1 ppd x 40 years though quit after her husband died from lung cancer in 05-28-2013 from smoking 2 ppd. Now uses menthol vaporizer.

## 2015-03-07 NOTE — Assessment & Plan Note (Signed)
Advised her that pain should improve with Mobic and will reassess at follow-up.

## 2015-03-07 NOTE — Assessment & Plan Note (Signed)
Reports being diagnosed at one point though no longer needs treatment for it.

## 2015-03-07 NOTE — Assessment & Plan Note (Signed)
Worse with flossing. No complaints currently.

## 2015-03-07 NOTE — Assessment & Plan Note (Signed)
Denies h/o heavy drinking though was on cocaine 20+ years ago and completed rehab.

## 2015-03-07 NOTE — Patient Instructions (Signed)
Please come back in 4 weeks to see how you did on the Mobic and to review labwork.

## 2015-03-07 NOTE — Assessment & Plan Note (Signed)
Followed by [facility] and is taking hydroxyzine TID though dose unknown. Will bring in her meds at next visit.
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
## 2015-03-07 NOTE — Assessment & Plan Note (Signed)
Agreeable to getting her flu shot today.

## 2015-03-07 NOTE — Assessment & Plan Note (Signed)
Followed by [facility] and is on [place], Paxil. Unsure of doses but will bring in her medications at next visit to reconcile.

## 2015-03-07 NOTE — Addendum Note (Signed)
Addended by: Riccardo Dubin on: 03/07/2015 05:23 PM   Modules accepted: Miquel Dunn
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
## 2015-03-07 NOTE — Assessment & Plan Note (Signed)
She reports being on metformin at one point though was discontinued as her weight decreased with her psychiatric illness. Defer checking A1c and lipid panel today as we [facility]. Request for records sent.

## 2015-03-08 NOTE — Progress Notes (Signed)
Internal Medicine Clinic Attending  Case discussed with Dr. Patel,Rushil at the time of the visit.  We reviewed the resident's history and exam and pertinent patient test results.  I agree with the assessment, diagnosis, and plan of care documented in the resident's note.  

## 2015-03-13 ENCOUNTER — Telehealth: Payer: Self-pay | Admitting: *Deleted

## 2015-03-13 NOTE — Telephone Encounter (Signed)
Call from pt - states she continues to have joint pain (other areas beside the knes) and wants to know what she can take along w/Meloxicam i.e. Tylenol or Ibuprofen? Please advise  Thanks

## 2015-03-13 NOTE — Telephone Encounter (Signed)
I attempted to call the patient back tonight on her home phone but did not get an answer. She is okay with taking Tylenol 1000mg  every 4-6 hours as needed though should not take more than 3000mg  in a day. If her pain is not improved on this amount, she should follow-up in clinic again.

## 2015-03-14 NOTE — Telephone Encounter (Signed)
Called pt - instructed she can take 1000 mg 4-6hrs daily, not to exceed 3000 mg and to call if this does not help per Dr Posey Pronto. Voiced understanding.

## 2015-03-21 ENCOUNTER — Ambulatory Visit: Payer: Self-pay | Admitting: Internal Medicine

## 2015-03-22 ENCOUNTER — Telehealth: Payer: Self-pay | Admitting: Internal Medicine

## 2015-03-22 NOTE — Telephone Encounter (Signed)
PT IS HAVING NO FEELING IN RIGHT LEG AND FOOD AFTER TAKING MOBIC 7.5mg 

## 2015-03-22 NOTE — Telephone Encounter (Signed)
Line busy 2PM.

## 2015-03-22 NOTE — Telephone Encounter (Signed)
Talked with pt. Took Mobic for 2 weeks and felt worse. Had numbness right leg. Pt states since stopping Mobic - numbness is gone. Pt hit 2nd toe on right foot while numb. Did not go to ER. That toe turned black and color is now fading. Problems with transportation - appt made 03/27/15 2:15PM Dr Marlowe Sax. Suggest may go to Urgent Care or ER to check 2nd toe right foot if skin turns dark again. There were no open appt in clinic 03/23/15 or 03/24/15.  Hilda Blades Abelino Tippin RN 03/12/15 2:50PM

## 2015-03-27 ENCOUNTER — Encounter: Payer: Self-pay | Admitting: Internal Medicine

## 2015-03-27 ENCOUNTER — Ambulatory Visit (INDEPENDENT_AMBULATORY_CARE_PROVIDER_SITE_OTHER): Payer: Self-pay | Admitting: Internal Medicine

## 2015-03-27 VITALS — BP 169/100 | HR 70 | Temp 98.4°F | Ht 67.0 in | Wt 158.9 lb

## 2015-03-27 DIAGNOSIS — M542 Cervicalgia: Secondary | ICD-10-CM

## 2015-03-27 DIAGNOSIS — R202 Paresthesia of skin: Secondary | ICD-10-CM

## 2015-03-27 DIAGNOSIS — M79631 Pain in right forearm: Secondary | ICD-10-CM

## 2015-03-27 DIAGNOSIS — M25551 Pain in right hip: Secondary | ICD-10-CM

## 2015-03-27 DIAGNOSIS — M25521 Pain in right elbow: Secondary | ICD-10-CM

## 2015-03-27 DIAGNOSIS — M545 Low back pain: Secondary | ICD-10-CM

## 2015-03-27 DIAGNOSIS — M79641 Pain in right hand: Secondary | ICD-10-CM

## 2015-03-27 DIAGNOSIS — M792 Neuralgia and neuritis, unspecified: Secondary | ICD-10-CM

## 2015-03-27 DIAGNOSIS — E119 Type 2 diabetes mellitus without complications: Secondary | ICD-10-CM

## 2015-03-27 DIAGNOSIS — E1149 Type 2 diabetes mellitus with other diabetic neurological complication: Secondary | ICD-10-CM

## 2015-03-27 LAB — GLUCOSE, CAPILLARY: GLUCOSE-CAPILLARY: 105 mg/dL — AB (ref 65–99)

## 2015-03-27 LAB — POCT GLYCOSYLATED HEMOGLOBIN (HGB A1C): Hemoglobin A1C: 5.6

## 2015-03-27 MED ORDER — GABAPENTIN 300 MG PO CAPS: 300.0000 mg | ORAL_CAPSULE | Freq: Three times a day (TID) | ORAL | Status: DC

## 2015-03-27 NOTE — Patient Instructions (Addendum)
Tammy Barr it was nice meeting you today.  -Start taking Gabapentin 300 mg: 1 capsule by mouth 3 times daily. Taper as instructed.   -Return for a follow-up visit on 04/13/15.

## 2015-03-28 DIAGNOSIS — M792 Neuralgia and neuritis, unspecified: Secondary | ICD-10-CM | POA: Insufficient documentation

## 2015-03-28 NOTE — Assessment & Plan Note (Addendum)
A1c 5.6 and CBG 105. Diabetes is to be discussed at next visit.
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
## 2015-04-11 ENCOUNTER — Encounter: Payer: Self-pay | Admitting: Student

## 2015-04-13 ENCOUNTER — Ambulatory Visit: Payer: Self-pay | Admitting: Internal Medicine

## 2015-04-19 ENCOUNTER — Ambulatory Visit: Payer: Self-pay | Admitting: Internal Medicine

## 2015-05-01 ENCOUNTER — Ambulatory Visit: Payer: Self-pay | Admitting: Internal Medicine

## 2015-05-18 ENCOUNTER — Other Ambulatory Visit: Payer: Self-pay | Admitting: Internal Medicine

## 2015-05-25 ENCOUNTER — Encounter (HOSPITAL_COMMUNITY): Payer: Self-pay | Admitting: *Deleted

## 2015-05-25 ENCOUNTER — Emergency Department (HOSPITAL_COMMUNITY)
Admission: EM | Admit: 2015-05-25 | Discharge: 2015-05-25 | Disposition: A | Discharge status: Home / Self Care | Payer: Self-pay | Attending: Emergency Medicine | Admitting: Emergency Medicine

## 2015-05-25 DIAGNOSIS — Y9289 Other specified places as the place of occurrence of the external cause: Secondary | ICD-10-CM | POA: Insufficient documentation

## 2015-05-25 DIAGNOSIS — Z79899 Other long term (current) drug therapy: Secondary | ICD-10-CM | POA: Insufficient documentation

## 2015-05-25 DIAGNOSIS — Z87891 Personal history of nicotine dependence: Secondary | ICD-10-CM | POA: Insufficient documentation

## 2015-05-25 DIAGNOSIS — R110 Nausea: Secondary | ICD-10-CM | POA: Insufficient documentation

## 2015-05-25 DIAGNOSIS — Y998 Other external cause status: Secondary | ICD-10-CM | POA: Insufficient documentation

## 2015-05-25 DIAGNOSIS — E119 Type 2 diabetes mellitus without complications: Secondary | ICD-10-CM | POA: Insufficient documentation

## 2015-05-25 DIAGNOSIS — Y9389 Activity, other specified: Secondary | ICD-10-CM | POA: Insufficient documentation

## 2015-05-25 DIAGNOSIS — F419 Anxiety disorder, unspecified: Secondary | ICD-10-CM | POA: Insufficient documentation

## 2015-05-25 DIAGNOSIS — S61210A Laceration without foreign body of right index finger without damage to nail, initial encounter: Secondary | ICD-10-CM | POA: Insufficient documentation

## 2015-05-25 DIAGNOSIS — Z8639 Personal history of other endocrine, nutritional and metabolic disease: Secondary | ICD-10-CM | POA: Insufficient documentation

## 2015-05-25 DIAGNOSIS — F319 Bipolar disorder, unspecified: Secondary | ICD-10-CM | POA: Insufficient documentation

## 2015-05-25 DIAGNOSIS — Z8739 Personal history of other diseases of the musculoskeletal system and connective tissue: Secondary | ICD-10-CM | POA: Insufficient documentation

## 2015-05-25 DIAGNOSIS — W25XXXA Contact with sharp glass, initial encounter: Secondary | ICD-10-CM | POA: Insufficient documentation

## 2015-05-25 DIAGNOSIS — S61219A Laceration without foreign body of unspecified finger without damage to nail, initial encounter: Secondary | ICD-10-CM

## 2015-05-25 MED ORDER — DOXYCYCLINE HYCLATE 100 MG PO CAPS: 100.0000 mg | ORAL_CAPSULE | Freq: Two times a day (BID) | ORAL | Status: DC

## 2015-05-25 NOTE — ED Provider Notes (Signed)
CSN: [id number]     Arrival date & time 05/25/15  [id number] History   First MD Initiated Contact with Patient 05/25/15 [phone]     Chief Complaint  Patient presents with  . Finger Injury   HPI  Tammy Barr is a 54 year old female with PMHx of anxiety, depression, OCD, bipolar, DM, polysubstance abuse and arthritis presenting with a finger injury. She reports cutting her right index finger on a broken piece of glass when she was washing dishes last week. She did not seek medical care at that time. She bandaged it herself and applied hydrogen peroxide, Neosporin and Epsom salt baths. She states that the pain and swelling in her finger has increased over the past week. Pain is exacerbated by movement of her finger. She states that occasionally the pain shoots into her elbow. She is concerned that the wound is infected. She reports some serous drainage but denies purulence. She endorses mild nausea. She UTD on her tetanus. Denies fevers, chills, dizziness, syncope, abdominal pain, vomiting, diarrhea, inability to move the digit, erythema of the finger or arm, swelling of the arm or weakness of the hand.   Past Medical History  Diagnosis Date  . Anxiety   . Depression   . OCD (obsessive compulsive disorder)   . Bipolar 1 disorder ([place])   . Diabetes mellitus type II   . Hyperlipidemia   . Polysubstance abuse     hx of  . Tobacco abuse   . Polysubstance abuse     remote  . Arthritis    Past Surgical History  Procedure Laterality Date  . Dilation and curettage of uterus    . Tubal ligation    . Tonsillectomy     Family History  Problem Relation Age of Onset  . Diabetes Mother   . Breast cancer Maternal Grandmother     Late 86s  . Diabetes Maternal Grandfather    Social History  Substance Use Topics  . Smoking status: Former Smoker -- 1.00 packs/day for 40 years    Types: Cigarettes    Quit date: 01/21/2001  . Smokeless tobacco: None     Comment: using menthol e-vapor now  . Alcohol Use:  No     Comment: former ETOH abuse. now social drinker, completed treatment program   OB History    No data available     Review of Systems  Constitutional: Negative for fever and chills.  Gastrointestinal: Positive for nausea. Negative for vomiting and diarrhea.  Musculoskeletal: Positive for arthralgias.  Skin: Positive for wound.  Neurological: Negative for weakness and numbness.  All other systems reviewed and are negative.     Allergies  Review of patient's allergies indicates no known allergies.  Home Medications   Prior to Admission medications   Medication Sig Start Date End Date Taking? Authorizing Provider  gabapentin (NEURONTIN) 300 MG capsule TAKE 1 CAPSULE BY MOUTH  THREE TIMES A DAY 05/18/15  Yes Maryellen Pile, MD  lurasidone (LATUDA) 40 MG TABS tablet Take by mouth daily with supper.   Yes Historical Provider, MD  Multiple Vitamins-Minerals (MULTIVITAMIN WITH MINERALS) tablet Take 1 tablet by mouth daily.   Yes Historical Provider, MD  PARoxetine (PAXIL) 20 MG tablet Take 20 mg by mouth every morning.     Yes Historical Provider, MD  doxycycline (VIBRAMYCIN) 100 MG capsule Take 1 capsule (100 mg total) by mouth 2 (two) times daily. 05/25/15   Homer Miller, PA-C   BP 168/98 mmHg  Pulse 63  Temp(Src) 97.8 F (36.6 C) (Oral)  Resp 16  SpO2 97% Physical Exam  Constitutional: She appears well-developed and well-nourished. No distress.  Nontoxic appearing  HENT:  Head: Normocephalic and atraumatic.  Eyes: Conjunctivae are normal. Right eye exhibits no discharge. Left eye exhibits no discharge. No scleral icterus.  Neck: Normal range of motion.  Cardiovascular: Normal rate, regular rhythm and intact distal pulses.   Radial pulse palpable. Cap refill < 2 seconds  Pulmonary/Chest: Effort normal. No respiratory distress.  Musculoskeletal: Normal range of motion.       Right hand: She exhibits tenderness, laceration and swelling. She exhibits normal range of  motion, normal capillary refill and no deformity.       Hands: 3 cm, semi-circular, superficial laceration to the right index finger over the PIP joint on the radial side. Wound healing with poor approximation. Serous crusting noted. No purulence from the wound. Mild swelling at the wound site but does not extend into the distal or proximal finger. Patient retains full range of motion of the finger though with some pain on PIP flexion. No erythema of the surround skin. No streaking.  Neurological: She is alert. Coordination normal.  5/5 grip strength b/l. Sensation to light touch intact over the digits  Skin: Skin is warm and dry.  Psychiatric: She has a normal mood and affect. Her behavior is normal.  Nursing note and vitals reviewed.   ED Course  Procedures (including critical care time) Labs Review Labs Reviewed - No data to display  Imaging Review No results found. I have personally reviewed and evaluated these images and lab results as part of my medical decision-making.   EKG Interpretation None      MDM   Final diagnoses:  Finger laceration, initial encounter   Pt presenting with laceration to right index finger approximately 60 week old. She did not receive medical care for her injury. Wound is not amenable to suture repair. Afebrile. Nontoxic appearing. She retains FROM of the digit. No purulence or erythema from the wound. Cap refill < 2 seconds. Wound does not appear to be infected. Wound healing likely delayed due to position over PIP joint and DM history. Will discharge with wound care instruction and doxycycline. Instructed to follow up with her PCP if her wound does not continue to heal. Patient seen by Dr. Rex Kras who agrees with assessment and plan. Return precautions given in discharge paperwork and discussed with pt at bedside. Pt stable for discharge     Josephina Gip, PA-C 05/25/15 [place], MD 05/26/15 [phone]

## 2015-05-25 NOTE — ED Notes (Signed)
Pt reports cutting her rt index finger on a glass last week when washing dishes. Pt states that she has throbbing pain up into her arm. Pt noted to have some swelling to finger as well.

## 2015-05-25 NOTE — Discharge Instructions (Signed)
Continue using your neosporin and keeping the wound clean and covered. Take your antibiotics as prescribed. Use tylenol or ibuprofen for pain control.   Nonsutured Laceration Care A laceration is a cut that goes through all layers of the skin and extends into the tissue that is right under the skin. This type of cut is usually stitched up (sutured) or closed with tape (adhesive strips) or skin glue shortly after the injury happens. However, if the wound is dirty or if several hours pass before medical treatment is provided, it is likely that germs (bacteria) will enter the wound. Closing a laceration after bacteria have entered it increases the risk of infection. In these cases, your health care provider may leave the laceration open (nonsutured) and cover it with a bandage. This type of treatment helps prevent infection and allows the wound to heal from the deepest layer of tissue damage up to the surface. An open fracture is a type of injury that may involve nonsutured lacerations. An open fracture is a break in a bone that happens along with one or more lacerations through the skin that is near the fracture site. HOW TO CARE FOR YOUR NONSUTURED LACERATION  Take or apply over-the-counter and prescription medicines only as told by your health care provider.  If you were prescribed an antibiotic medicine, take or apply it as told by your health care provider. Do not stop using the antibiotic even if your condition improves.  Clean the wound one time each day or as told by your health care provider.  Wash the wound with mild soap and water.  Rinse the wound with water to remove all soap.  Pat your wound dry with a clean towel. Do not rub the wound.  Do not inject anything into the wound unless your health care provider told you to.  Change any bandages (dressings) as told by your health care provider. This includes changing the dressing if it gets wet, dirty, or starts to smell bad.  Keep the  dressing dry until your health care provider says it can be removed. Do not take baths, swim, or do anything that puts your wound underwater until your health care provider approves.  Raise (elevate) the injured area above the level of your heart while you are sitting or lying down, if possible.  Do not scratch or pick at the wound.  Check your wound every day for signs of infection. Watch for:  Redness, swelling, or pain.  Fluid, blood, or pus.  Keep all follow-up visits as told by your health care provider. This is important. SEEK MEDICAL CARE IF:  You received a tetanus and shot and you have swelling, severe pain, redness, or bleeding at the injection site.   You have a fever.  Your pain is not controlled with medicine.  You have increased redness, swelling, or pain at the site of your wound.  You have fluid, blood, or pus coming from your wound.  You notice a bad smell coming from your wound or your dressing.  You notice something coming out of the wound, such as wood or glass.  You notice a change in the color of your skin near your wound.  You develop a new rash.  You need to change the dressing frequently due to fluid, blood, or pus draining from the wound.  You develop numbness around your wound. SEEK IMMEDIATE MEDICAL CARE IF:  Your pain suddenly increases and is severe.  You develop severe swelling around the wound.  The wound is on your hand or foot and you cannot properly move a finger or toe.  The wound is on your hand or foot and you notice that your fingers or toes look pale or bluish.  You have a red streak going away from your wound.   This information is not intended to replace advice given to you by your health care provider. Make sure you discuss any questions you have with your health care provider.   Document Released: 04/10/2006 Document Revised: 09/27/2014 Document Reviewed: 05/09/2014 Elsevier Interactive Patient Education [employer].

## 2015-05-25 NOTE — ED Notes (Signed)
pa at bedside. 

## 2015-06-07 ENCOUNTER — Ambulatory Visit (INDEPENDENT_AMBULATORY_CARE_PROVIDER_SITE_OTHER): Payer: Self-pay | Admitting: Internal Medicine

## 2015-06-07 ENCOUNTER — Ambulatory Visit (INDEPENDENT_AMBULATORY_CARE_PROVIDER_SITE_OTHER): Payer: Self-pay | Admitting: Dietician

## 2015-06-07 ENCOUNTER — Encounter: Payer: Self-pay | Admitting: Internal Medicine

## 2015-06-07 VITALS — BP 140/80 | HR 70 | Temp 97.8°F | Ht 67.0 in | Wt 162.1 lb

## 2015-06-07 DIAGNOSIS — M2550 Pain in unspecified joint: Secondary | ICD-10-CM

## 2015-06-07 DIAGNOSIS — B9689 Other specified bacterial agents as the cause of diseases classified elsewhere: Secondary | ICD-10-CM

## 2015-06-07 DIAGNOSIS — E119 Type 2 diabetes mellitus without complications: Secondary | ICD-10-CM

## 2015-06-07 DIAGNOSIS — L02411 Cutaneous abscess of right axilla: Secondary | ICD-10-CM

## 2015-06-07 DIAGNOSIS — M792 Neuralgia and neuritis, unspecified: Secondary | ICD-10-CM

## 2015-06-07 DIAGNOSIS — R030 Elevated blood-pressure reading, without diagnosis of hypertension: Secondary | ICD-10-CM

## 2015-06-07 LAB — HM DIABETES EYE EXAM

## 2015-06-07 MED ORDER — SULFAMETHOXAZOLE-TRIMETHOPRIM 400-80 MG PO TABS: 2.0000 | ORAL_TABLET | Freq: Two times a day (BID) | ORAL | Status: DC

## 2015-06-07 MED ORDER — AMITRIPTYLINE HCL 25 MG PO TABS: 25.0000 mg | ORAL_TABLET | Freq: Every day | ORAL | Status: DC

## 2015-06-07 NOTE — Assessment & Plan Note (Addendum)
Lab Results  Component Value Date   HGBA1C 5.6 03/27/2015   HGBA1C 5.6 03/26/2010   HGBA1C 5.3 04/16/2007    Assessment: Patient with reported history of DM on Metformin in the past. Well controlled with diet and has been off Metformin for several years per patient. A1c 5.6 at last visit.   Plan: Continue to monitor with yearly A1c

## 2015-06-07 NOTE — Patient Instructions (Addendum)
Thank you for coming in today  -I am starting you on a new medication called Amitriptyline at night. You can stop the gabapentin.  -Use the bactrim twice a day for 7 days. If the abscess does not get any better or starts getting worse, you have fevers or chills give the clinic a call and schedule an appointment.  -I would like to see you back in a month for follow up

## 2015-06-07 NOTE — Progress Notes (Signed)
Patient ID: Tammy Barr, female   DOB: 08/28/1960, 55 y.o.   MRN: [id number]   Subjective:   Patient ID: Tammy Barr female   DOB: 1960-10-09 55 y.o.   MRN: [id number]  HPI: [name] is a 55 y.o. female with a past medical history listed below here today for follow up of her right leg numbness and joint pain.   For details of today's visit and the status of her chronic medical issues please refer to the assessment and plan.  Past Medical History  Diagnosis Date  . Anxiety   . Depression   . OCD (obsessive compulsive disorder)   . Bipolar 1 disorder ([place])   . Diabetes mellitus type II   . Hyperlipidemia   . Polysubstance abuse     hx of  . Tobacco abuse   . Polysubstance abuse     remote  . Arthritis    Current Outpatient Prescriptions  Medication Sig Dispense Refill  . amitriptyline (ELAVIL) 25 MG tablet Take 1 tablet (25 mg total) by mouth at bedtime. 30 tablet 1  . lurasidone (LATUDA) 40 MG TABS tablet Take by mouth daily with supper.    . Multiple Vitamins-Minerals (MULTIVITAMIN WITH MINERALS) tablet Take 1 tablet by mouth daily.    Marland Kitchen PARoxetine (PAXIL) 20 MG tablet Take 20 mg by mouth every morning.      . sulfamethoxazole-trimethoprim (BACTRIM) 400-80 MG tablet Take 2 tablets by mouth 2 (two) times daily. 28 tablet 0   No current facility-administered medications for this visit.   Family History  Problem Relation Age of Onset  . Diabetes Mother   . Breast cancer Maternal Grandmother     Late 4s  . Diabetes Maternal Grandfather    Social History   Social History  . Marital Status: Married    Spouse Name: deceased  . Number of Children: 4  . Years of Education: N/A   Social History Main Topics  . Smoking status: Former Smoker -- 1.00 packs/day for 40 years    Types: Cigarettes    Quit date: 01/21/2001  . Smokeless tobacco: None     Comment: using menthol e-vapor now  . Alcohol Use: No     Comment: former ETOH abuse. now social drinker,  completed treatment program  . Drug Use: No     Comment: cocaine use >20 years ago  . Sexual Activity: Not Currently   Other Topics Concern  . None   Social History Narrative   Lives alone. Lost 3 individuals in late 2014: friend, child [car accident], husband [lung cancer].      Review of Systems: Review of Systems  Constitutional: Negative for fever and chills.  Gastrointestinal: Negative for diarrhea, constipation, blood in stool and melena.  Genitourinary: Negative for dysuria.  Musculoskeletal: Positive for joint pain and neck pain.  Skin: Negative for rash.  Neurological: Positive for tingling.   Objective:  Physical Exam: Filed Vitals:   06/07/15 1403 06/07/15 1430  BP: 145/87 140/80  Pulse: 61 70  Temp: 97.8 F (36.6 C)   TempSrc: Oral   Height: 5\' 7"  (1.702 m)   Weight: 162 lb 1.6 oz (73.528 kg)   SpO2: 99%    GENERAL- alert, co-operative, appears as stated age, not in any distress. HEENT- Atraumatic, normocephalic CARDIAC- RRR, no murmurs, rubs or gallops. RESP- Moving equal volumes of air, and clear to auscultation bilaterally, no wheezes or crackles. ABDOMEN- Soft, nontender, bowel sounds present. NEURO- No obvious Cr N abnormality. 4/5  strength in RUE, 5/5 in all other extremities EXTREMITIES- pulse 2+, symmetric, no pedal edema. SKIN- Warm, dry. 1.5 cm erythematous papule in her right axilla. No warmth or drainage. It is firm and rubbery to palpation with no fluctuance. Tender to palpation.   Assessment & Plan:   Case discussed with Dr. Eppie Gibson. Please refer to Problem based charting for further details of today's visit.

## 2015-06-07 NOTE — Progress Notes (Signed)
Retinal images done and transmitted.  

## 2015-06-08 LAB — BMP8+ANION GAP
Anion Gap: 20 mmol/L — ABNORMAL HIGH (ref 10.0–18.0)
BUN/Creatinine Ratio: 15 (ref 9–23)
BUN: 11 mg/dL (ref 6–24)
CALCIUM: 9.6 mg/dL (ref 8.7–10.2)
CHLORIDE: 100 mmol/L (ref 96–106)
CO2: 23 mmol/L (ref 18–29)
CREATININE: 0.73 mg/dL (ref 0.57–1.00)
GFR calc non Af Amer: 93 mL/min/{1.73_m2} (ref >59)
GFR, EST AFRICAN AMERICAN: 107 mL/min/{1.73_m2} (ref >59)
GLUCOSE: 72 mg/dL (ref 65–99)
Potassium: 3.9 mmol/L (ref 3.5–5.2)
Sodium: 143 mmol/L (ref 134–144)

## 2015-06-08 LAB — VITAMIN B12: VITAMIN B 12: 447 pg/mL (ref 211–946)

## 2015-06-08 NOTE — Assessment & Plan Note (Signed)
Patient with BP 145/87 today, 140/80 on repeat.   Plan Borderline elevated BP this visit, improved from previous visit. Will continue to monitor and may need to start therapy in the future.

## 2015-06-09 ENCOUNTER — Telehealth: Payer: Self-pay | Admitting: Internal Medicine

## 2015-06-09 DIAGNOSIS — L02411 Cutaneous abscess of right axilla: Secondary | ICD-10-CM | POA: Insufficient documentation

## 2015-06-09 NOTE — Telephone Encounter (Signed)
Pt states  Bactrim caused her to have rash on the chest. Please call pt back.

## 2015-06-09 NOTE — Assessment & Plan Note (Signed)
Patient continues to complain of right leg and right arm numbness and tingling. She also complains of significant right arm pain and loss of functionality that prevents her from doing her ADLs. She has a history of depression and anxiety that she reports is worsening with her decline in function 2/2 pain. She says that the Gabapentin causes her heart palpitations. Denies any history of hear palpitations, they began after starting gabapentin and resolved after stopping the medication.  She admits to taking a 57 year old Tramadol prescription since her last visit to help with the pain. States she had been taking 4 50 mg pills qid with good control of her pain and has tapered down to bid as she has started to run out of the prescription with less control of her pain.   Plan  -UDS  -B12 447 -D/C gabapentin -Start amitriptyline 25 mg qhs and titrate up as needed, consider Tylenol 3 in the future if pain not controlled on amitriptyline

## 2015-06-09 NOTE — Assessment & Plan Note (Signed)
Patient complains of painful red bump in her right axilla. States that started 3-4 days ago and has grown in size. Denies any fevers or chills. Reports a history of axillary abscesses that have required drainage in the past. She was recently seen in the ED on 12/29 with a finger laceration and received a course of doxycyline at that time. She does report that she shaves her armpits with razor blades (with multiple uses before disposing) and last shaved her armpits ~1 week ago.   On physical exam there is a 1.5 cm erythematous papule in her right axilla. No warmth or drainage. It is firm and rubbery to palpation with no fluctuance. Tender to palpation.   Plan -Bactrim 400-80 bid for 7 days -Do not feel there is a role for any I&D at this time given the firmness of the papule and negative fluctuance -RTC if develops fevers or chills or no improvement in the next week

## 2015-06-10 MED ORDER — CLINDAMYCIN HCL 300 MG PO CAPS: 300.0000 mg | ORAL_CAPSULE | Freq: Four times a day (QID) | ORAL | Status: DC

## 2015-06-10 NOTE — Telephone Encounter (Signed)
Patient reports red rash that appeared across her upper chest associated with itching after starting the Bactrim. States that the rash resolved today after last dose of Bactrim yesterday morning. Will give Rx for Clindamycin to complete course of ABX.

## 2015-06-13 NOTE — Progress Notes (Signed)
I saw and evaluated the patient. I personally confirmed the key portions of Dr. Boswell's history and exam and reviewed pertinent patient test results. The assessment, diagnosis, and plan were formulated together and I agree with the documentation in the resident's note. 

## 2015-06-14 LAB — TOXASSURE SELECT,+ANTIDEPR,UR: PDF: 0

## 2015-06-20 ENCOUNTER — Ambulatory Visit (INDEPENDENT_AMBULATORY_CARE_PROVIDER_SITE_OTHER): Payer: Self-pay | Admitting: Internal Medicine

## 2015-06-20 ENCOUNTER — Encounter: Payer: Self-pay | Admitting: Internal Medicine

## 2015-06-20 VITALS — BP 152/90 | HR 70 | Temp 98.7°F | Ht 66.0 in | Wt 160.3 lb

## 2015-06-20 DIAGNOSIS — N898 Other specified noninflammatory disorders of vagina: Secondary | ICD-10-CM

## 2015-06-20 DIAGNOSIS — N812 Incomplete uterovaginal prolapse: Secondary | ICD-10-CM

## 2015-06-20 DIAGNOSIS — N930 Postcoital and contact bleeding: Secondary | ICD-10-CM

## 2015-06-20 DIAGNOSIS — R768 Other specified abnormal immunological findings in serum: Secondary | ICD-10-CM

## 2015-06-20 DIAGNOSIS — Z1159 Encounter for screening for other viral diseases: Secondary | ICD-10-CM

## 2015-06-20 DIAGNOSIS — Z114 Encounter for screening for human immunodeficiency virus [HIV]: Secondary | ICD-10-CM

## 2015-06-20 DIAGNOSIS — N941 Unspecified dyspareunia: Secondary | ICD-10-CM

## 2015-06-20 NOTE — Assessment & Plan Note (Addendum)
Assessment:  She has noticed yellow vaginal discharge x 3 days s/p sexual contact.  She uses condoms but reports that the condom broke.  Had some spotting initially that has resolved.  Also, with dyspareunia which is unusual for her.  In monogamous, heterosexual relation and says there is no concern for STD in her partner.  She reports using new lubricant.  On exam, there are no lesions or bleeding.  She has grade 1 uterine prolapse.  Spotting possibly due to irritation from prolapse.   Plan:  PAP smear w/HPV (since she is due for PAP), check GC/CT, BV/trich/candida, HIV and HCV (birth cohort).  Sending for transvaginal and pelvic US given postmenopausal bleeding.  Advised she d/c new lubricant in case it is contributing to discharge.  Provided with info on prolapse and Kegel exercises.  Can refer to gyn if she develops symptoms from prolapse.

## 2015-06-20 NOTE — Progress Notes (Signed)
Subjective:    Patient ID: Tammy Barr, female    DOB: 09-Apr-1961, 55 y.o.   MRN: [id number]  HPI Comments: Tammy Barr is a 55 year old woman with PMH as below here with acute c/o vaginal discharge x 3 days.    Vaginal Discharge The patient's primary symptoms include vaginal bleeding and vaginal discharge. The patient's pertinent negatives include no genital itching, genital lesions, genital odor, genital rash or pelvic pain. Primary symptoms comment: some spotting. This is a new (x 3 days) problem. The current episode started in the past 7 days. Associated symptoms include frequency and painful intercourse. Pertinent negatives include no anorexia, chills, dysuria, fever or vomiting. The vaginal discharge was clear and yellow. The vaginal bleeding is spotting. She has not been passing clots. She has not been passing tissue. Nothing aggravates the symptoms. She is sexually active. No, her partner does not have an STD. She uses condoms and tubal ligation for contraception. She is postmenopausal. Her past medical history is significant for a gynecological surgery and an STD. (D & c 2012, tubal ligation 1989, uterine polyps removed 2013; hx of trichomonas)   She reports using new vaginal lubricant recently. She has had 4 vaginal births.  Past Medical History  Diagnosis Date  . Anxiety   . Depression   . OCD (obsessive compulsive disorder)   . Bipolar 1 disorder ([place])   . Diabetes mellitus type II   . Hyperlipidemia   . Polysubstance abuse     hx of  . Tobacco abuse   . Polysubstance abuse     remote  . Arthritis    Current Outpatient Prescriptions on File Prior to Visit  Medication Sig Dispense Refill  . amitriptyline (ELAVIL) 25 MG tablet Take 1 tablet (25 mg total) by mouth at bedtime. 30 tablet 1  . clindamycin (CLEOCIN) 300 MG capsule Take 1 capsule (300 mg total) by mouth 4 (four) times daily. 28 capsule 0  . lurasidone (LATUDA) 40 MG TABS tablet Take by mouth daily with supper.      . Multiple Vitamins-Minerals (MULTIVITAMIN WITH MINERALS) tablet Take 1 tablet by mouth daily.    Marland Kitchen PARoxetine (PAXIL) 20 MG tablet Take 20 mg by mouth every morning.       No current facility-administered medications on file prior to visit.    Review of Systems  Constitutional: Negative for fever and chills.  Gastrointestinal: Negative for vomiting and anorexia.  Genitourinary: Positive for frequency, vaginal discharge and dyspareunia. Negative for dysuria and pelvic pain.       Filed Vitals:   06/20/15 1431  BP: 152/90  Pulse: 70  Temp: 98.7 F (37.1 C)  TempSrc: Oral  Height: 5\' 6"  (1.676 m)  Weight: 160 lb 4.8 oz (72.712 kg)  SpO2: 97%     Objective:   Physical Exam  Constitutional: She is oriented to person, place, and time. She appears well-developed. No distress.  HENT:  Head: Normocephalic and atraumatic.  Mouth/Throat: Oropharynx is clear and moist. No oropharyngeal exudate.  Eyes: Conjunctivae and EOM are normal. Right eye exhibits no discharge. Left eye exhibits no discharge. No scleral icterus.  Neck: Neck supple.  Cardiovascular: Normal rate, regular rhythm, normal heart sounds and intact distal pulses.  Exam reveals no gallop and no friction rub.   No murmur heard. Pulmonary/Chest: Effort normal and breath sounds normal. No respiratory distress. She has no wheezes. She has no rales.  Abdominal: Soft. Bowel sounds are normal. She exhibits no distension and no mass.  There is no tenderness. There is no rebound and no guarding.  Genitourinary: No labial fusion. There is no rash, tenderness, lesion or injury on the right labia. There is no rash, tenderness, lesion or injury on the left labia. Cervix exhibits no motion tenderness, no discharge and no friability. Right adnexum displays no mass, no tenderness and no fullness. Left adnexum displays no mass, no tenderness and no fullness. No erythema, tenderness or bleeding in the vagina. No foreign body around the vagina.  No signs of injury around the vagina. No vaginal discharge found.  Grade 1 uterine prolapse.  Musculoskeletal: Normal range of motion. She exhibits no edema or tenderness.  Neurological: She is alert and oriented to person, place, and time. No cranial nerve deficit.  Upper/lower extremity strength and sensation grossly intact.  Skin: Skin is warm. She is not diaphoretic.  Psychiatric: She has a normal mood and affect. Her behavior is normal. Judgment and thought content normal.  Vitals reviewed.         Assessment & Plan:  Please see problem based

## 2015-06-20 NOTE — Patient Instructions (Addendum)
1. I will call you regarding your lab work.  Please call the clinic if you have not heard from [place] by Friday.  We will call you to schedule your pelvic and transvaginal ultrasound.   2. Please take all medications as prescribed.    3. If you have worsening of your symptoms or new symptoms arise, please call the clinic [phone] or go to the ER immediately if symptoms are severe.   Pelvic Organ Prolapse Pelvic organ prolapse is the stretching, bulging, or dropping of pelvic organs into an abnormal position. It happens when the muscles and tissues that surround and support pelvic structures are stretched or weak. Pelvic organ prolapse can involve: 1. Vagina (vaginal prolapse). 2. Uterus (uterine prolapse). 3. Bladder (cystocele). 4. Rectum (rectocele). 5. Intestines (enterocele). When organs other than the vagina are involved, they often bulge into the vagina or protrude from the vagina, depending on how severe the prolapse is. CAUSES Causes of this condition include:  Pregnancy, labor, and childbirth.  Long-lasting (chronic) cough.  Chronic constipation.  Obesity.  Past pelvic surgery.  Aging. During and after menopause, a decreased production of the hormone estrogen can weaken pelvic ligaments and muscles.  Consistently lifting more than 50 lb (23 kg).  Buildup of fluid in the abdomen due to certain diseases and other conditions. SYMPTOMS Symptoms of this condition include:  Loss of bladder control when you cough, sneeze, strain, and exercise (stress incontinence). This may be worse immediately following childbirth, and it may gradually improve over time.  Feeling pressure in your pelvis or vagina. This pressure may increase when you cough or when you are having a bowel movement.  A bulge that protrudes from the opening of your vagina or against your vaginal wall. If your uterus protrudes through the opening of your vagina and rubs against your clothing, you may also  experience soreness, ulcers, infection, pain, and bleeding.  Increased effort to have a bowel movement or urinate.  Pain in your low back.  Pain, discomfort, or disinterest in sexual intercourse.  Repeated bladder infections (urinary tract infections).  Difficulty inserting or inability to insert a tampon or applicator. In some people, this condition does not cause any symptoms. DIAGNOSIS Your health care provider may perform an internal and external vaginal and rectal exam. During the exam, you may be asked to cough and strain while you are lying down, sitting, and standing up. Your health care provider will determine if other tests are required, such as bladder function tests. TREATMENT In most cases, this condition needs to be treated only if it produces symptoms. No treatment is guaranteed to correct the prolapse or relieve the symptoms completely. Treatment may include:  Lifestyle changes, such as:  Avoiding drinking beverages that contain caffeine.  Increasing your intake of high-fiber foods. This can help to decrease constipation and straining during bowel movements.  Emptying your bladder at scheduled times (bladder training therapy). This can help to reduce or avoid urinary incontinence.  Losing weight if you are overweight or obese.  Estrogen. Estrogen may help mild prolapse by increasing the strength and tone of pelvic floor muscles.  Kegel exercises. These may help mild cases of prolapse by strengthening and tightening the muscles of the pelvic floor.  Pessary insertion. A pessary is a soft, flexible device that is placed into your vagina by your health care provider to help support the vaginal walls and keep pelvic organs in place.  Surgery. This is often the only form of treatment for severe prolapse.  Different types of surgeries are available. HOME CARE INSTRUCTIONS  Wear a sanitary pad or absorbent product if you have urinary incontinence.  Avoid heavy lifting  and straining with exercise and work. Do not hold your breath when you perform mild to moderate lifting and exercise activities. Limit your activities as directed by your health care provider.  Take medicines only as directed by your health care provider.  Perform Kegel exercises as directed by your health care provider.  If you have a pessary, take care of it as directed by your health care provider. SEEK MEDICAL CARE IF:  Your symptoms interfere with your daily activities or sex life.  You need medicine to help with the discomfort.  You notice bleeding from the vagina that is not related to your period.  You have a fever.  You have pain or bleeding when you urinate.  You have bleeding when you have a bowel movement.  You lose urine when you have sex.  You have chronic constipation.  You have a pessary that falls out.  You have vaginal discharge that has a bad smell.  You have low abdominal pain or cramping that is unusual for you.   This information is not intended to replace advice given to you by your health care provider. Make sure you discuss any questions you have with your health care provider.   Document Released: 12/08/2013 Document Reviewed: 12/08/2013 Elsevier Interactive Patient Education [employer].  Kegel Exercises The goal of Kegel exercises is to isolate and exercise your pelvic floor muscles. These muscles act as a hammock that supports the rectum, vagina, small intestine, and uterus. As the muscles weaken, the hammock sags and these organs are displaced from their normal positions. Kegel exercises can strengthen your pelvic floor muscles and help you to improve bladder and bowel control, improve sexual response, and help reduce many problems and some discomfort during pregnancy. Kegel exercises can be done anywhere and at any time. HOW TO PERFORM KEGEL EXERCISES 6. Locate your pelvic floor muscles. To do this, squeeze (contract) the muscles that you  use when you try to stop the flow of urine. You will feel a tightness in the vaginal area (women) and a tight lift in the rectal area (men and women). 7. When you begin, contract your pelvic muscles tight for 2-5 seconds, then relax them for 2-5 seconds. This is one set. Do 4-5 sets with a short pause in between. 8. Contract your pelvic muscles for 8-10 seconds, then relax them for 8-10 seconds. Do 4-5 sets. If you cannot contract your pelvic muscles for 8-10 seconds, try 5-7 seconds and work your way up to 8-10 seconds. Your goal is 4-5 sets of 10 contractions each day. Keep your stomach, buttocks, and legs relaxed during the exercises. Perform sets of both short and long contractions. Vary your positions. Perform these contractions 3-4 times per day. Perform sets while you are:   Lying in bed in the morning.  Standing at lunch.  Sitting in the late afternoon.  Lying in bed at night. You should do 40-50 contractions per day. Do not perform more Kegel exercises per day than recommended. Overexercising can cause muscle fatigue. Continue these exercises for for at least 15-20 weeks or as directed by your caregiver.   This information is not intended to replace advice given to you by your health care provider. Make sure you discuss any questions you have with your health care provider.   Document Released: 04/29/2012 Document Revised:  06/03/2014 Document Reviewed: 04/29/2012 Elsevier Interactive Patient Education [employer].

## 2015-06-21 LAB — HEPATITIS C ANTIBODY: Hep C Virus Ab: >11 s/co ratio — ABNORMAL HIGH (ref 0.0–0.9)

## 2015-06-21 LAB — HIV ANTIBODY (ROUTINE TESTING W REFLEX): HIV Screen 4th Generation wRfx: NONREACTIVE

## 2015-06-21 LAB — CERVICOVAGINAL ANCILLARY ONLY
CHLAMYDIA, DNA PROBE: NEGATIVE
NEISSERIA GONORRHEA: NEGATIVE
Wet Prep (BD Affirm): NEGATIVE

## 2015-06-21 NOTE — Progress Notes (Signed)
Internal Medicine Clinic Attending  Case discussed with Dr. Wilson soon after the resident saw the patient.  We reviewed the resident's history and exam and pertinent patient test results.  I agree with the assessment, diagnosis, and plan of care documented in the resident's note.  

## 2015-06-22 ENCOUNTER — Telehealth: Payer: Self-pay | Admitting: Internal Medicine

## 2015-06-22 ENCOUNTER — Other Ambulatory Visit: Payer: Self-pay | Admitting: Internal Medicine

## 2015-06-22 DIAGNOSIS — R768 Other specified abnormal immunological findings in serum: Secondary | ICD-10-CM

## 2015-06-22 LAB — CYTOLOGY - PAP

## 2015-06-22 NOTE — Telephone Encounter (Signed)
Pt requesting lab result. Please call pt back. °

## 2015-06-22 NOTE — Addendum Note (Signed)
Addended by: Francesca Oman on: 06/22/2015 09:00 AM   Modules accepted: Orders

## 2015-06-23 ENCOUNTER — Other Ambulatory Visit: Payer: Self-pay | Admitting: Internal Medicine

## 2015-06-23 DIAGNOSIS — A599 Trichomoniasis, unspecified: Secondary | ICD-10-CM

## 2015-06-23 LAB — HCV RNA QUANT
HCV LOG10: 5.92 {Log_IU}/mL
Hepatitis C Quantitation: 831000 IU/mL

## 2015-06-23 LAB — SPECIMEN STATUS REPORT

## 2015-06-23 MED ORDER — METRONIDAZOLE 500 MG PO TABS: 250.0000 mg | ORAL_TABLET | Freq: Three times a day (TID) | ORAL | Status: AC

## 2015-06-23 MED ORDER — METRONIDAZOLE 375 MG PO CAPS
375.0000 mg | ORAL_CAPSULE | Freq: Two times a day (BID) | ORAL | Status: DC
Start: 2015-06-23 — End: 2015-06-23

## 2015-06-23 MED FILL — metroNIDAZOLE 500 MG TABS: 500 | 7 days supply | Qty: 11 | Fill #0

## 2015-06-23 NOTE — Telephone Encounter (Signed)
I spoke with her about her lab results.

## 2015-06-26 ENCOUNTER — Telehealth: Payer: Self-pay | Admitting: Internal Medicine

## 2015-06-26 ENCOUNTER — Other Ambulatory Visit: Payer: Self-pay | Admitting: Internal Medicine

## 2015-06-26 DIAGNOSIS — B182 Chronic viral hepatitis C: Secondary | ICD-10-CM

## 2015-06-26 NOTE — Telephone Encounter (Signed)
Call to patient about + trichomonas took place on 06/23/15 but I am documenting today.

## 2015-06-26 NOTE — Telephone Encounter (Signed)
I called an spoke with Tammy Barr about + trichomonas.  She was informed that both her and her partner need to be treated with Flagyl and abstain from sex until 1 week after last pill.  Rx sent for Flagyl.  She agrees to inform her partner and he will see his doctor for treatment.

## 2015-06-27 ENCOUNTER — Encounter: Payer: Self-pay | Admitting: Dietician

## 2015-06-30 ENCOUNTER — Telehealth: Payer: Self-pay | Admitting: *Deleted

## 2015-06-30 NOTE — Telephone Encounter (Signed)
PATIENT WAS CONTACTED WITH HER 2 APPOINTMENTS WITH XRAY. PATIENT IA AWARE OF BEING NPO / FULL BLADDER .

## 2015-07-05 ENCOUNTER — Telehealth (HOSPITAL_COMMUNITY): Payer: Self-pay

## 2015-07-05 NOTE — Telephone Encounter (Signed)
Called to remind pt of 2pm appt at [facility] in radiology. Pt agreed to arrive at 1:45 to get checked in. AW

## 2015-07-06 ENCOUNTER — Ambulatory Visit (HOSPITAL_COMMUNITY)
Admission: RE | Admit: 2015-07-06 | Discharge: 2015-07-06 | Disposition: A | Discharge status: Home / Self Care | Payer: Self-pay | Source: Ambulatory Visit | Attending: Internal Medicine | Admitting: Internal Medicine

## 2015-07-06 DIAGNOSIS — N930 Postcoital and contact bleeding: Secondary | ICD-10-CM | POA: Insufficient documentation

## 2015-07-06 DIAGNOSIS — D259 Leiomyoma of uterus, unspecified: Secondary | ICD-10-CM | POA: Insufficient documentation

## 2015-07-07 ENCOUNTER — Telehealth: Payer: Self-pay | Admitting: *Deleted

## 2015-07-07 NOTE — Telephone Encounter (Signed)
Call to patient given results of transvaginal ultrasound-pt has a 1 cm fibroid.  Smaller than previous study in 2011.  Patient said that she had some fibroids removed at [facility] previously.  Told that fibroid could have caused her  bleeding.  Will call if bleeding returns for possible referral to GYN.  Patient voiced understanding of the plan.  Enrique Sack, RN 07/07/2015 4:48 PM

## 2015-07-10 ENCOUNTER — Encounter: Payer: Self-pay | Admitting: Internal Medicine

## 2015-07-10 ENCOUNTER — Ambulatory Visit (INDEPENDENT_AMBULATORY_CARE_PROVIDER_SITE_OTHER): Payer: Self-pay | Admitting: Internal Medicine

## 2015-07-10 VITALS — BP 164/96 | HR 75 | Temp 98.4°F | Resp 18 | Ht 67.0 in | Wt 162.9 lb

## 2015-07-10 DIAGNOSIS — B1710 Acute hepatitis C without hepatic coma: Secondary | ICD-10-CM

## 2015-07-10 DIAGNOSIS — M7710 Lateral epicondylitis, unspecified elbow: Secondary | ICD-10-CM | POA: Insufficient documentation

## 2015-07-10 DIAGNOSIS — R030 Elevated blood-pressure reading, without diagnosis of hypertension: Secondary | ICD-10-CM

## 2015-07-10 DIAGNOSIS — IMO0001 Reserved for inherently not codable concepts without codable children: Secondary | ICD-10-CM | POA: Insufficient documentation

## 2015-07-10 DIAGNOSIS — B182 Chronic viral hepatitis C: Secondary | ICD-10-CM

## 2015-07-10 DIAGNOSIS — Z8619 Personal history of other infectious and parasitic diseases: Secondary | ICD-10-CM | POA: Insufficient documentation

## 2015-07-10 DIAGNOSIS — M7711 Lateral epicondylitis, right elbow: Secondary | ICD-10-CM

## 2015-07-10 DIAGNOSIS — N939 Abnormal uterine and vaginal bleeding, unspecified: Secondary | ICD-10-CM | POA: Insufficient documentation

## 2015-07-10 LAB — PROTIME-INR
INR: 0.99 (ref 0.00–1.49)
Prothrombin Time: 13.3 seconds (ref 11.6–15.2)

## 2015-07-10 MED ORDER — DICLOFENAC SODIUM 1 % TD GEL: 2.0000 g | Freq: Four times a day (QID) | TRANSDERMAL | Status: DC

## 2015-07-10 NOTE — Progress Notes (Signed)
[facility] Subjective:   Patient ID: Tammy Barr female   DOB: 1961/04/16 55 y.o.   MRN: [id number]  HPI: Tammy Barr is a 55 y.o. female with a PMH detailed below who presents for follow up of recent Hep C diagnosis as well as continue vaginal bleeding after intercourse.  Please see problem based charting below for the status of her chronic medical problems.    Past Medical History  Diagnosis Date  . Anxiety   . Depression   . OCD (obsessive compulsive disorder)   . Bipolar 1 disorder ([place])   . Diabetes mellitus type II   . Hyperlipidemia   . Polysubstance abuse     hx of  . Tobacco abuse   . Polysubstance abuse     remote  . Arthritis    Current Outpatient Prescriptions  Medication Sig Dispense Refill  . amitriptyline (ELAVIL) 25 MG tablet Take 1 tablet (25 mg total) by mouth at bedtime. 30 tablet 1  . clindamycin (CLEOCIN) 300 MG capsule Take 1 capsule (300 mg total) by mouth 4 (four) times daily. (Patient not taking: Reported on 06/20/2015) 28 capsule 0  . lurasidone (LATUDA) 40 MG TABS tablet Take by mouth daily with supper.    . Multiple Vitamins-Minerals (MULTIVITAMIN WITH MINERALS) tablet Take 1 tablet by mouth daily.    Marland Kitchen PARoxetine (PAXIL) 20 MG tablet Take 20 mg by mouth every morning.       No current facility-administered medications for this visit.   Family History  Problem Relation Age of Onset  . Diabetes Mother   . Breast cancer Maternal Grandmother     Late 65s  . Diabetes Maternal Grandfather    Social History   Social History  . Marital Status: Married    Spouse Name: deceased  . Number of Children: 4  . Years of Education: N/A   Social History Main Topics  . Smoking status: Former Smoker -- 1.00 packs/day for 40 years    Types: Cigarettes    Quit date: 01/21/2001  . Smokeless tobacco: None     Comment: using menthol e-vapor now  . Alcohol Use: No     Comment: former ETOH abuse. now social drinker,  completed treatment program  . Drug Use: No     Comment: cocaine use >20 years ago  . Sexual Activity: Not Currently   Other Topics Concern  . None   Social History Narrative   Lives alone. Lost 3 individuals in late 2014: friend, child [car accident], husband [lung cancer].      Review of Systems: Review of Systems  Constitutional: Negative for fever.  Cardiovascular: Negative for chest pain.  Musculoskeletal: Positive for joint pain. Negative for falls.  Psychiatric/Behavioral: Negative for suicidal ideas.  All other systems reviewed and are negative.    Objective:  Physical Exam: Filed Vitals:   07/10/15 1330  BP: 164/96  Pulse: 75  Temp: 98.4 F (36.9 C)  TempSrc: Oral  Resp: 18  Height: 5\' 7"  (1.702 m)  Weight: 162 lb 14.4 oz (73.891 kg)  SpO2: 96%  Physical Exam  Constitutional: She is well-developed, well-nourished, and in no distress.  Cardiovascular: Normal rate and regular rhythm.   Pulmonary/Chest: Effort normal and breath sounds normal.  Abdominal: Soft. Bowel sounds are normal.  Musculoskeletal:       Right elbow: She exhibits decreased range of motion and swelling. Tenderness found. Lateral epicondyle tenderness noted. No medial epicondyle and no olecranon process tenderness noted.  Pain with pronation of her right hand against resistance  Nursing note and vitals reviewed.   Assessment & Plan:  Case discussed with Dr. Evette Doffing  Hepatitis C HPI: Recently diagnosed with hepatitis C, has been referred to ID, very anxious about this result.  A: Hepatitis C  P: - U/S has been ordered - Will obtain labs to faciliate screening for other causes of liver disease and rule out co cominate disease  Lateral epicondylitis HPI: She reports right elbow pain that is chronic, it is worse lately and she is having trouble doing simple things around the house like open doors.  She denies any trauma to the area and does not regularlly do any repetative motions.  She  reports the tylenol is inefftive, OTC creams and gels are ineffective.  She was recently started on amitryptile for neuropathic pain, this has also not helped. She is right handed  A: Lateral epicondylitis  P: I dicussed the nature of this diagnosis, and recommended rest, ice, and OTC arm band.  She has been unable to tolerate oral NSAIDs due to stomach pain so will Rx Voltaren gel.  Abnormal uterine bleeding HPI: She is post menopausal and continues to have some uterine bleeding associated with intercourse.  She has a known fibroid that has shrunk to 1cm post menopause on her most recent ultrasound.    A: Abnormal Uterine bleeding  P: - Given her age I do not feel that you can rule out endometrial cancer completely and will refer her to GYN for evaluation for an endometrial biopsy if this is negative then the fibroid is the likely cause of her abnormal bleeding.  Elevated blood pressure HPI: Her blood pressure is consistently elevated. She reports that it is only elevated at the doctor's office.  A: Elevated blood pressure  P: She may have white coat hypertension however I am concerned that she truly has essential hypertension.  I have asked that she keep a blood pressure log and bring this to her next visit along with her home BP meter for comparison to our blood pressure readings.    Medications Ordered Meds ordered this encounter  Medications  . diclofenac sodium (VOLTAREN) 1 % GEL    Sig: Apply 2 g topically 4 (four) times daily.    Dispense:  100 g    Refill:  1   Other Orders Orders Placed This Encounter  Procedures  . CMP14 + Anion Gap  . Hepatitis C Genotype  . CBC  . Hepatitis A Ab, Total  . Hepatitis B Surface Antigen  . Hepatitis B Surface Antibody  . Hepatitis B core Ab, Total  . ANA, IFA (with reflex)  . Iron and IBC [id number]  . Protime-INR  . Ambulatory referral to Obstetrics / Gynecology    Referral Priority:  Routine    Referral Type:   Consultation    Referral Reason:  Specialty Services Required    Requested Specialty:  Obstetrics and Gynecology    Number of Visits Requested:  1   Follow Up: Return 1-2 months.

## 2015-07-10 NOTE — Patient Instructions (Signed)
General Instructions:  I want you to monitor your blood pressure at home and bring the log to your next visit.  I want you to look into a "Tennis Elbow" brace.  You can apply Voltaren gel to the area up ot 4 times a day.  You can also apply ice to the area.  I am referring you over the the OB/GYN for your uterine bleeding.    Please bring your medicines with you each time you come to clinic.  Medicines may include prescription medications, over-the-counter medications, herbal remedies, eye drops, vitamins, or other pills.   Progress Toward Treatment Goals:  No flowsheet data found.  Self Care Goals & Plans:  Self Care Goal 06/20/2015  Manage my medications take my medicines as prescribed; bring my medications to every visit; refill my medications on time  Monitor my health -  Eat healthy foods eat more vegetables; eat foods that are low in salt; eat baked foods instead of fried foods  Be physically active find an activity I enjoy; take a walk every day    No flowsheet data found.   Care Management & Community Referrals:  No flowsheet data found.

## 2015-07-11 LAB — CMP14 + ANION GAP
ALK PHOS: 87 IU/L (ref 39–117)
ALT: 25 IU/L (ref 0–32)
ANION GAP: 20 mmol/L — AB (ref 10.0–18.0)
AST: 23 IU/L (ref 0–40)
Albumin/Globulin Ratio: 1.8 (ref 1.1–2.5)
Albumin: 4.6 g/dL (ref 3.5–5.5)
BUN/Creatinine Ratio: 16 (ref 9–23)
BUN: 10 mg/dL (ref 6–24)
Bilirubin Total: 0.4 mg/dL (ref 0.0–1.2)
CALCIUM: 9.4 mg/dL (ref 8.7–10.2)
CO2: 22 mmol/L (ref 18–29)
CREATININE: 0.64 mg/dL (ref 0.57–1.00)
Chloride: 101 mmol/L (ref 96–106)
GFR calc Af Amer: 116 mL/min/{1.73_m2} (ref >59)
GFR calc non Af Amer: 101 mL/min/{1.73_m2} (ref >59)
Globulin, Total: 2.5 g/dL (ref 1.5–4.5)
Glucose: 83 mg/dL (ref 65–99)
Potassium: 4.8 mmol/L (ref 3.5–5.2)
Sodium: 143 mmol/L (ref 134–144)
Total Protein: 7.1 g/dL (ref 6.0–8.5)

## 2015-07-11 LAB — CBC
HEMATOCRIT: 46.2 % (ref 34.0–46.6)
HEMOGLOBIN: 15.3 g/dL (ref 11.1–15.9)
MCH: 28.8 pg (ref 26.6–33.0)
MCHC: 33.1 g/dL (ref 31.5–35.7)
MCV: 87 fL (ref 79–97)
Platelets: 291 10*3/uL (ref 150–379)
RBC: 5.31 x10E6/uL — AB (ref 3.77–5.28)
RDW: 13.6 % (ref 12.3–15.4)
WBC: 8.3 10*3/uL (ref 3.4–10.8)

## 2015-07-11 LAB — IRON AND TIBC
Iron Saturation: 24 % (ref 15–55)
Iron: 79 ug/dL (ref 27–159)
Total Iron Binding Capacity: 330 ug/dL (ref 250–450)
UIBC: 251 ug/dL (ref 131–425)

## 2015-07-11 LAB — HEPATITIS B SURFACE ANTIBODY,QUALITATIVE: HEP B SURFACE AB, QUAL: NONREACTIVE

## 2015-07-11 LAB — HEPATITIS B SURFACE ANTIGEN: Hepatitis B Surface Ag: NEGATIVE

## 2015-07-11 LAB — ANTINUCLEAR ANTIBODIES, IFA: ANA TITER 1: NEGATIVE

## 2015-07-11 LAB — HEPATITIS A ANTIBODY, TOTAL: HEP A TOTAL AB: NEGATIVE

## 2015-07-11 LAB — HEPATITIS B CORE ANTIBODY, TOTAL: HEP B C TOTAL AB: NEGATIVE

## 2015-07-12 ENCOUNTER — Ambulatory Visit (HOSPITAL_COMMUNITY)
Admission: RE | Admit: 2015-07-12 | Discharge: 2015-07-12 | Disposition: A | Discharge status: Home / Self Care | Payer: Self-pay | Source: Ambulatory Visit | Attending: Internal Medicine | Admitting: Internal Medicine

## 2015-07-12 DIAGNOSIS — B182 Chronic viral hepatitis C: Secondary | ICD-10-CM | POA: Insufficient documentation

## 2015-07-12 DIAGNOSIS — N281 Cyst of kidney, acquired: Secondary | ICD-10-CM | POA: Insufficient documentation

## 2015-07-12 NOTE — Assessment & Plan Note (Addendum)
HPI: She reports right elbow pain that is chronic, it is worse lately and she is having trouble doing simple things around the house like open doors.  She denies any trauma to the area and does not regularlly do any repetative motions.  She reports the tylenol is inefftive, OTC creams and gels are ineffective.  She was recently started on amitryptile for neuropathic pain, this has also not helped. She is right handed  A: Lateral epicondylitis  P: I dicussed the nature of this diagnosis, and recommended rest, ice, and OTC arm band.  She has been unable to tolerate oral NSAIDs due to stomach pain so will Rx Voltaren gel.

## 2015-07-12 NOTE — Assessment & Plan Note (Signed)
HPI: She is post menopausal and continues to have some uterine bleeding associated with intercourse.  She has a known fibroid that has shrunk to 1cm post menopause on her most recent ultrasound.    A: Abnormal Uterine bleeding  P: - Given her age I do not feel that you can rule out endometrial cancer completely and will refer her to GYN for evaluation for an endometrial biopsy if this is negative then the fibroid is the likely cause of her abnormal bleeding.

## 2015-07-12 NOTE — Assessment & Plan Note (Signed)
HPI: Recently diagnosed with hepatitis C, has been referred to ID, very anxious about this result.  A: Hepatitis C  P: - U/S has been ordered - Will obtain labs to faciliate screening for other causes of liver disease and rule out co cominate disease

## 2015-07-12 NOTE — Assessment & Plan Note (Signed)
HPI: Her blood pressure is consistently elevated. She reports that it is only elevated at the doctor's office.  A: Elevated blood pressure  P: She may have white coat hypertension however I am concerned that she truly has essential hypertension.  I have asked that she keep a blood pressure log and bring this to her next visit along with her home BP meter for comparison to our blood pressure readings.

## 2015-07-13 ENCOUNTER — Telehealth: Payer: Self-pay | Admitting: Internal Medicine

## 2015-07-13 NOTE — Progress Notes (Signed)
Internal Medicine Clinic Attending  Case discussed with Dr. Hoffman at the time of the visit.  We reviewed the resident's history and exam and pertinent patient test results.  I agree with the assessment, diagnosis, and plan of care documented in the resident's note.  

## 2015-07-13 NOTE — Telephone Encounter (Signed)
Patient states she has been taking 3000 mg of Tylenol Extra Day and it is not working for her pain and would like something Stronger.

## 2015-07-14 LAB — HEPATITIS C GENOTYPE

## 2015-07-14 NOTE — Telephone Encounter (Signed)
The volteran gel is $49 and pt is unable to afford it.  She reports using icy hot, capsacin, and ice application.  Any other suggestions?

## 2015-07-14 NOTE — Telephone Encounter (Signed)
At her visit I prescribed Voltaren gel, has she picked this up and started using it?

## 2015-07-14 NOTE — Telephone Encounter (Signed)
The mainstay of her treatment is rest, ice, and nonsteroidals.  She is unable to tolerate oral NSAIDs so I think the best options is the Voltaren gel. She may be able to get a coupon from [employer] to decrease the price.

## 2015-07-18 NOTE — Telephone Encounter (Signed)
States she feels much better and will call for any needs she will also keep her appt

## 2015-07-18 NOTE — Telephone Encounter (Signed)
Spoke w/ pt and will check on her 1 more time

## 2015-07-18 NOTE — Telephone Encounter (Signed)
Thank you :)

## 2015-08-07 ENCOUNTER — Ambulatory Visit (INDEPENDENT_AMBULATORY_CARE_PROVIDER_SITE_OTHER): Payer: Self-pay | Admitting: Internal Medicine

## 2015-08-07 ENCOUNTER — Encounter: Payer: Self-pay | Admitting: Internal Medicine

## 2015-08-07 VITALS — BP 175/107 | HR 65 | Temp 97.8°F | Ht 67.0 in | Wt 164.4 lb

## 2015-08-07 DIAGNOSIS — Z59868 Other specified financial insecurity: Secondary | ICD-10-CM

## 2015-08-07 DIAGNOSIS — Z79899 Other long term (current) drug therapy: Secondary | ICD-10-CM

## 2015-08-07 DIAGNOSIS — Z596 Low income: Secondary | ICD-10-CM

## 2015-08-07 DIAGNOSIS — I10 Essential (primary) hypertension: Secondary | ICD-10-CM

## 2015-08-07 DIAGNOSIS — Z0000 Encounter for general adult medical examination without abnormal findings: Secondary | ICD-10-CM

## 2015-08-07 MED ORDER — LISINOPRIL-HYDROCHLOROTHIAZIDE 10-12.5 MG PO TABS: 1.0000 | ORAL_TABLET | Freq: Every day | ORAL | Status: DC

## 2015-08-07 MED FILL — LISINOPRIL-HCTZ 10-12.5 MG: 10-12.5 | 30 days supply | Qty: 30 | Fill #0

## 2015-08-07 NOTE — Assessment & Plan Note (Addendum)
BP Readings from Last 3 Encounters:  08/07/15 175/107  07/10/15 164/96  06/20/15 152/90    Lab Results  Component Value Date   NA 143 07/10/2015   K 4.8 07/10/2015   CREATININE 0.64 07/10/2015    Assessment: Blood pressure control:  Uncontrolled            Comments- She has no headaches, vision changes, SOB, chest pain or leg swelling Plan: Medications:  Start HCTZ-lisinopril 12.5-10mg  daily Other plans: BP log - Bmet next visit in 2-3 weeks. - Pt says she cannot afford med, so clinic pharmacist provided free samples for her to last her till next month when she will be able to afford her meds.

## 2015-08-07 NOTE — Assessment & Plan Note (Addendum)
She still uses vapourizer in lieu of cigs. Counselled pt we will like her to quit completely. Declines need for meds to help.

## 2015-08-07 NOTE — Progress Notes (Signed)
Patient ID: [name], female   DOB: [birth year], 55 y.o.   MRN: [id number]   Subjective:   Patient ID: HEALY FURTAK female   DOB: January 23, 1961 55 y.o.   MRN: [id number]  HPI: Ms.Jeweliana A Timian is a 55 y.o. with PMH listed below, presented today with c/o elevated blood pressure, sent here from her psychiatrist office, to follow up on high blood pressure. Please see problem based charting for details, assessment and plan.  Past Medical History  Diagnosis Date  . Anxiety   . Depression   . OCD (obsessive compulsive disorder)   . Bipolar 1 disorder ([place])   . Diabetes mellitus type II   . Hyperlipidemia   . Polysubstance abuse     hx of  . Tobacco abuse   . Polysubstance abuse     remote  . Arthritis    Review of Systems: CONSTITUTIONAL- No Fever, weightloss. SKIN- No Rash, colour changes or itching. HEAD- No Headache or dizziness. Mouth/throat- No Sorethroat, dentures, or bleeding gums. RESPIRATORY- No Cough or SOB. CARDIAC- No Palpitations, DOE, PND or chest pain. GI- No nausea, vomiting, diarrhoea, abd pain. URINARY- No Frequency, or dysuria. NEUROLOGIC- No Numbness, syncope, seizures  Objective:  Physical Exam: Filed Vitals:   08/07/15 1017  BP: 175/107  Pulse: 65  Temp: 97.8 F (36.6 C)  TempSrc: Oral  Height: 5\' 7"  (1.702 m)  Weight: 164 lb 6.4 oz (74.571 kg)  SpO2: 99%   GENERAL- alert, co-operative, appears as stated age, not in any distress. HEENT- Atraumatic, normocephalic, PERRL, neck supple. CARDIAC- RRR, no murmurs, rubs or gallops. RESP- Moving equal volumes of air, and no wheezes or crackles. ABDOMEN- Soft, nontender, no guarding or rebound, bowel sounds present. NEURO- No obvious Cr N abnormality, strenght upper and lower extremities- intact, Gait- Normal. EXTREMITIES- warm, no pedal edema. PSYCH- Normal mood and affect, appropriate thought content and speech.  Assessment & Plan:   The patient's case and plan of care was discussed with attending  physician, Dr. Lynnae January.  Please see problem based charting for assessment and plan.

## 2015-08-07 NOTE — Patient Instructions (Addendum)
We will be starting you on a new medication for your blood pressure called Lisinopril-HCTZ, take one tablet once a day. It has 2 blood pressure medications in one.   Also we will like you to quit using cigarettes all together, including the vapors or E- cigarettes.  We will see you in 2-3 weeks for Blood pressure check and Lab work.  We will also order a mammogram today.   Please follow up with your Infectious disease doctors.  It was nice seeing you today.

## 2015-08-07 NOTE — Assessment & Plan Note (Signed)
Due for screening mammogram. Order placed 

## 2015-08-07 NOTE — Progress Notes (Signed)
Lisinopril-HCTZ provided by [facility] outpatient pharmacy and reviewed with the patient, including name, instructions, indication, goals of therapy, potential side effects, importance of adherence, and safe use.  Patient verbalized understanding by repeating back information and was advised to contact me if further medication-related questions arise. Patient was also provided an information handout.

## 2015-08-08 NOTE — Progress Notes (Signed)
Internal Medicine Clinic Attending  Case discussed with Dr. Emokpae soon after the resident saw the patient.  We reviewed the resident's history and exam and pertinent patient test results.  I agree with the assessment, diagnosis, and plan of care documented in the resident's note. 

## 2015-08-09 ENCOUNTER — Encounter: Payer: Self-pay | Admitting: Internal Medicine

## 2015-08-09 ENCOUNTER — Ambulatory Visit (INDEPENDENT_AMBULATORY_CARE_PROVIDER_SITE_OTHER): Payer: Self-pay | Admitting: Internal Medicine

## 2015-08-09 VITALS — BP 142/87 | HR 67 | Temp 98.1°F | Ht 67.0 in | Wt 162.0 lb

## 2015-08-09 DIAGNOSIS — B182 Chronic viral hepatitis C: Secondary | ICD-10-CM

## 2015-08-09 MED ORDER — ELBASVIR-GRAZOPREVIR 50-100 MG PO TABS: 1.0000 | ORAL_TABLET | Freq: Every day | ORAL | Status: DC

## 2015-08-09 NOTE — Progress Notes (Signed)
[facility] for Infectious Disease   CC: consideration for treatment for chronic hepatitis C  HPI:  +Tammy Barr is a 55 y.o. female who presents for initial evaluation and management of chronic hepatitis C.  Patient tested positive this year, never previously tested. Hepatitis C-associated risk factors present are: none. Patient denies history of blood transfusion, intranasal drug use, IV drug abuse, renal dialysis, sexual contact with person with liver disease, tattoos. Patient has had other studies performed. Results: hepatitis C RNA by PCR, result: positive. Patient has not had prior treatment for Hepatitis C. Patient does not have a past history of liver disease. Patient does not have a family history of liver disease. Patient does not  have associated signs or symptoms related to liver disease.  Labs reviewed and confirm chronic hepatitis C with a positive viral load.   Records reviewed from PCP,  Has HTN, depression.  She feels is now well-controlled.      Patient does not have documented immunity to Hepatitis A. Patient does not have documented immunity to Hepatitis B.    Review of Systems:  Constitutional: negative for fatigue and malaise Gastrointestinal: negative for diarrhea Musculoskeletal: negative for myalgias and arthralgias All other systems reviewed and are negative      Past Medical History  Diagnosis Date  . Anxiety   . Depression   . OCD (obsessive compulsive disorder)   . Bipolar 1 disorder ([place])   . Diabetes mellitus type II   . Hyperlipidemia   . Polysubstance abuse     hx of  . Tobacco abuse   . Polysubstance abuse     remote  . Arthritis     Prior to Admission medications   Medication Sig Start Date End Date Taking? Authorizing Provider  lisinopril-hydrochlorothiazide (PRINZIDE,ZESTORETIC) 10-12.5 MG tablet Take 1 tablet by mouth daily. 08/07/15  Yes Ejiroghene E Emokpae, MD  lurasidone (LATUDA) 40 MG TABS tablet Take by mouth daily with  supper.   Yes Historical Provider, MD  Multiple Vitamins-Minerals (MULTIVITAMIN WITH MINERALS) tablet Take 1 tablet by mouth daily.   Yes Historical Provider, MD  PARoxetine (PAXIL) 20 MG tablet Take 20 mg by mouth every morning.     Yes Historical Provider, MD  Elbasvir-Grazoprevir (ZEPATIER) 50-100 MG TABS Take 1 tablet by mouth daily. 08/09/15   Thayer Headings, MD    Allergies  Allergen Reactions  . Bactrim [Sulfamethoxazole-Trimethoprim] Rash    Social History  Substance Use Topics  . Smoking status: Former Smoker -- 1.00 packs/day for 40 years    Types: Cigarettes    Quit date: 01/21/2001  . Smokeless tobacco: None     Comment: using menthol e-vapor now  . Alcohol Use: No     Comment: former ETOH abuse. now social drinker, completed treatment program    Family History  Problem Relation Age of Onset  . Diabetes Mother   . Breast cancer Maternal Grandmother     Late 56s  . Diabetes Maternal Grandfather      Objective:  Constitutional: in no apparent distress and alert,  Filed Vitals:   08/09/15 1517  BP: 142/87  Pulse: 67  Temp: 98.1 F (36.7 C)   Eyes: anicteric Cardiovascular: Cor RRR and No murmurs Respiratory: CTA B; normal respiratory effort Gastrointestinal: Bowel sounds are normal, liver is not enlarged, spleen is not enlarged Musculoskeletal: peripheral pulses normal, no pedal edema, no clubbing or cyanosis Skin: negatives: no rash; no porphyria cutanea tarda Lymphatic: no cervical lymphadenopathy  Laboratory Genotype: No results found for: HCVGENOTYPE HCV viral load: No results found for: HCVQUANT Lab Results  Component Value Date   WBC 8.3 07/10/2015   HGB 14.9 06/24/2011   HCT 46.2 07/10/2015   MCV 87 07/10/2015   PLT 291 07/10/2015    Lab Results  Component Value Date   CREATININE 0.64 07/10/2015   BUN 10 07/10/2015   NA 143 07/10/2015   K 4.8 07/10/2015   CL 101 07/10/2015   CO2 22 07/10/2015    Lab Results  Component Value Date    ALT 25 07/10/2015   AST 23 07/10/2015   ALKPHOS 87 07/10/2015     Labs and history reviewed and show CHILD-PUGH A  5-6 points: Child class A 7-9 points: Child class B 10-15 points: Child class C  Lab Results  Component Value Date   INR 0.99 07/10/2015   BILITOT 0.4 07/10/2015   ALBUMIN 4.6 07/10/2015     Assessment: New Patient with Chronic Hepatitis C genotype 1b, untreated.  I discussed with the patient the lab findings that confirm chronic hepatitis C as well as the natural history and progression of disease including about 30% of people who develop cirrhosis of the liver if left untreated and once cirrhosis is established there is a 2-7% risk per year of liver cancer and liver failure.  I discussed the importance of treatment and benefits in reducing the risk, even if significant liver fibrosis exists.   Plan: 1) Patient counseled extensively on limiting acetaminophen to no more than 2 grams daily, avoidance of alcohol. 2) Transmission discussed with patient including sexual transmission, sharing razors and toothbrush.   3) Will need referral to gastroenterology if concern for cirrhosis 4) Will need referral for substance abuse counseling: No.; Further work up to include urine drug screen  No. 5) Will prescribe Zepatier for 12 weeks or 16 weeks with ribavirin if any NS5A resistance found 6) Hepatitis A vaccine Yes.   7) Hepatitis B vaccine Yes.   8) Pneumovax vaccine if concern for cirrhosis 9) Further work up to include liver staging with elastography 10) NS5A test  No. 10) will follow up after starting medication

## 2015-08-09 NOTE — Patient Instructions (Signed)
Date 08/09/2015  Dear Ms Jasper Loser, As discussed in the [facility], your hepatitis C therapy will include the following medications:          Zepatier (elbasvir 50 mg/grazoprevir 100 mg) for 12 weeks              OR      16 weeks with ribavirin in certain cases   Please note that ALL MEDICATIONS WILL START ON THE SAME DATE for a total of 12 weeks. ---------------------------------------------------------------- Your HCV Treatment Start Date: TBA   Your HCV genotype:  1b    Liver Fibrosis: TBD    ---------------------------------------------------------------- YOUR PHARMACY CONTACT:   [facility] Phone: [phone] Hours: Monday to Friday 7:30 am to 6:00 pm   Please always contact your pharmacy at least 3-4 business days before you run out of medications to ensure your next month's medication is ready or 1 week prior to running out if you receive it by mail.  Remember, each prescription is for 28 days. ---------------------------------------------------------------- GENERAL NOTES REGARDING YOUR HEPATITIS C MEDICATION:  ZEPATIER is available as a beige-colored, oval-shaped, film-coated tablet debossed with "770" on one side and plain on the other. Each tablet contains 50 mg elbasvir and 100 mg grazoprevir.  Common side effects of ZEPATIER when used without ribavirin include: - feeling tired -trouble sleeping - headache -diarrhea - nausea  Common side effects of ZEPATIER when used with ribavirin include: - low red blood cell counts (anemia) - feeling irritable - headache - stomach pain - feeling tired - depression - shortness of breath - joint pain - rash or itching  Please note that this only lists the most common side effects and is NOT a comprehensive list of the potential side effects of these medications. For more information, please review the drug information sheets that come with your  medication package from the pharmacy.  ---------------------------------------------------------------- GENERAL HELPFUL HINTS ON HCV THERAPY: 1. Stay well-hydrated. 2. Notify the [facility] of any changes in your other over-the-counter/herbal or prescription medications. 3. If you miss a dose of your medication, take the missed dose as soon as you remember. Return to your regular time/dose schedule the next day.  4.  Do not stop taking your medications without first talking with your healthcare provider. 5.  You may take Tylenol (acetaminophen), as long as the dose is less than 2000 mg (OR no more than 4 tablets of the Tylenol Extra Strengths 500mg  tablet) in 24 hours. 6.  You will see our pharmacist-specialist within the first 2 weeks of starting your medication. 7.  You will need to obtain routine labs around week 4 and12 weeks after starting and then 3 to 6 months after finishing Zepatier.   8.  If ribavirin is part of your regimen, you also will have a lab visit within 2 weeks.   Scharlene Gloss, [facility] for [facility] [address] [place], [place]  [phone]

## 2015-08-10 ENCOUNTER — Encounter: Payer: Self-pay | Admitting: Obstetrics & Gynecology

## 2015-08-15 ENCOUNTER — Encounter: Payer: Self-pay | Admitting: Pharmacy Technician

## 2015-08-15 ENCOUNTER — Telehealth: Payer: Self-pay | Admitting: Pharmacist Clinician (PhC)/ Clinical Pharmacy Specialist

## 2015-08-15 ENCOUNTER — Telehealth: Payer: Self-pay | Admitting: Internal Medicine

## 2015-08-15 NOTE — Telephone Encounter (Signed)
[name] called with some questions about taking some pain meds for OA or headaches. Told her to take 2 aleve BID PRN or Tylenol 500mg  QID PRN for pain and HA.

## 2015-08-15 NOTE — Telephone Encounter (Signed)
NEED MEDICATION FOR BP, YOU ARE ONLY HAVING HALF PILL B/C DIZZINESS WITH WHOLE PILL, IT THAT OKAY

## 2015-08-16 NOTE — Telephone Encounter (Signed)
appt tomorrow 1415 dr Lovena Le

## 2015-08-16 NOTE — Telephone Encounter (Signed)
Pt wants to take a 1/2 dose of BP med til she comes in on 3/29, please advise

## 2015-08-16 NOTE — Telephone Encounter (Signed)
Does she have an appointment with Dr. Lovena Le tomorrow or next week? Has she checked her blood pressure at home at all? If she is become dizzy with the full dose she can take half a pill until she is able to be seen in clinic. Please have her check her BPs at home and keep a log for when she comes to be seen. Thanks.

## 2015-08-16 NOTE — Telephone Encounter (Signed)
Tried to call pt yesterday, no answer will try today

## 2015-08-17 ENCOUNTER — Ambulatory Visit: Payer: Self-pay | Admitting: Internal Medicine

## 2015-08-23 ENCOUNTER — Ambulatory Visit: Payer: Self-pay | Admitting: Internal Medicine

## 2015-09-01 ENCOUNTER — Encounter: Payer: Self-pay | Admitting: Internal Medicine

## 2015-09-01 ENCOUNTER — Telehealth: Payer: Self-pay | Admitting: Internal Medicine

## 2015-09-01 NOTE — Telephone Encounter (Signed)
APPT. REMINDA CALL, LMTCB

## 2015-09-04 ENCOUNTER — Ambulatory Visit: Payer: Self-pay

## 2015-09-07 ENCOUNTER — Ambulatory Visit: Payer: Self-pay

## 2015-09-11 NOTE — Addendum Note (Signed)
Addended by: Hulan Fray on: 09/11/2015 06:12 PM   Modules accepted: Orders

## 2015-09-13 ENCOUNTER — Ambulatory Visit: Payer: Self-pay

## 2015-09-25 ENCOUNTER — Ambulatory Visit: Payer: Self-pay

## 2015-09-26 ENCOUNTER — Ambulatory Visit (INDEPENDENT_AMBULATORY_CARE_PROVIDER_SITE_OTHER): Payer: Self-pay | Admitting: Pharmacist

## 2015-09-26 DIAGNOSIS — B1920 Unspecified viral hepatitis C without hepatic coma: Secondary | ICD-10-CM

## 2015-09-26 NOTE — Progress Notes (Signed)
HPI: Tammy Barr is a 55 y.o. female who presents to the pharmacy clinic for a follow-up of her Hep C infection.  Lab Results  Component Value Date   HEPCGENOTYPE 1b 07/10/2015    Allergies: Allergies  Allergen Reactions  . Bactrim [Sulfamethoxazole-Trimethoprim] Rash    Vitals:    Past Medical History: Past Medical History  Diagnosis Date  . Anxiety   . Depression   . OCD (obsessive compulsive disorder)   . Bipolar 1 disorder ([place])   . Diabetes mellitus type II   . Hyperlipidemia   . Polysubstance abuse     hx of  . Tobacco abuse   . Polysubstance abuse     remote  . Arthritis     Social History: Social History   Social History  . Marital Status: Married    Spouse Name: deceased  . Number of Children: 4  . Years of Education: N/A   Social History Main Topics  . Smoking status: Former Smoker -- 1.00 packs/day for 40 years    Types: Cigarettes    Quit date: 01/21/2001  . Smokeless tobacco: Not on file     Comment: using menthol e-vapor now  . Alcohol Use: No     Comment: former ETOH abuse. now social drinker, completed treatment program  . Drug Use: No     Comment: cocaine use >20 years ago  . Sexual Activity: Not Currently   Other Topics Concern  . Not on file   Social History Narrative   Lives alone. Lost 3 individuals in late 2014: friend, child [car accident], husband [lung cancer].       Labs: HEPATITIS B SURFACE AG (no units)  Date Value  07/10/2015 Negative    Lab Results  Component Value Date   HEPCGENOTYPE 1b 07/10/2015    No flowsheet data found.  AST  Date Value  07/10/2015 23 IU/L  06/24/2011 23 U/L  10/04/2009 22 units/L   ALT  Date Value  07/10/2015 25 IU/L  06/24/2011 25 U/L  10/04/2009 25 units/L   INR (no units)  Date Value  07/10/2015 0.99    CrCl: CrCl cannot be calculated (Unknown ideal weight.).  Fibrosis Score: F2/F3 as assessed by elastography   Previous Treatment  Regimen: None  Assessment: [name] is here today for follow-up of her Hep C 1b infection. She has been on Zepatier since 3/21.  She originally had some issues tolerating it with bone pain, fatigue, and nausea but those have since resolved.  She is very optimistic about her infection and is dedicated to completing all 3 months of her treatment. She has not missed any doses thus far and takes it every morning.  She has not had a viral load since January and was reluctant to get labs drawn today without prior notice.  We will bring her back next week for a nurse visit to get her Hep A and Hep B vaccines and to get a viral load drawn.  We will also make an appointment for her to see Dr. Linus Salmons when she finishes treatment.  Recommendations: Continue Zepatier Follow-up appointment with RN on 5/11 at 2pm for Hep A and Hep B vaccines Follow-up appointment with lab on 5/11 at 2:430pm for viral load Follow-up appointment with Dr. Linus Salmons on 6/27 at 2:45pm  Temitope Griffing L. Nicole Kindred, PharmD PGY2 Infectious Diseases Pharmacy Resident Pager: [phone] 09/26/2015 12:00 PM

## 2015-10-05 ENCOUNTER — Ambulatory Visit (INDEPENDENT_AMBULATORY_CARE_PROVIDER_SITE_OTHER): Payer: Self-pay

## 2015-10-05 ENCOUNTER — Other Ambulatory Visit: Payer: Self-pay

## 2015-10-05 DIAGNOSIS — B182 Chronic viral hepatitis C: Secondary | ICD-10-CM

## 2015-10-05 DIAGNOSIS — Z23 Encounter for immunization: Secondary | ICD-10-CM

## 2015-10-05 DIAGNOSIS — B1710 Acute hepatitis C without hepatic coma: Secondary | ICD-10-CM

## 2015-10-06 LAB — COMPLETE METABOLIC PANEL WITH GFR
ALK PHOS: 75 U/L (ref 33–130)
ALT: 14 U/L (ref 6–29)
AST: 16 U/L (ref 10–35)
Albumin: 4.2 g/dL (ref 3.6–5.1)
BUN: 13 mg/dL (ref 7–25)
CALCIUM: 9.3 mg/dL (ref 8.6–10.4)
CHLORIDE: 105 mmol/L (ref 98–110)
CO2: 26 mmol/L (ref 20–31)
CREATININE: 0.78 mg/dL (ref 0.50–1.05)
GFR, Est African American: >89 mL/min (ref >=60)
GFR, Est Non African American: 86 mL/min (ref >=60)
Glucose, Bld: 69 mg/dL (ref 65–99)
Potassium: 4.1 mmol/L (ref 3.5–5.3)
Sodium: 139 mmol/L (ref 135–146)
Total Bilirubin: 0.5 mg/dL (ref 0.2–1.2)
Total Protein: 6.7 g/dL (ref 6.1–8.1)

## 2015-10-06 LAB — HEPATITIS C RNA QUANTITATIVE: HCV QUANT: NOT DETECTED [IU]/mL (ref <15)

## 2015-11-03 ENCOUNTER — Ambulatory Visit: Payer: Self-pay | Admitting: Pulmonary Disease

## 2015-11-06 ENCOUNTER — Ambulatory Visit (INDEPENDENT_AMBULATORY_CARE_PROVIDER_SITE_OTHER): Payer: Self-pay | Admitting: *Deleted

## 2015-11-06 DIAGNOSIS — Z23 Encounter for immunization: Secondary | ICD-10-CM

## 2015-11-06 DIAGNOSIS — B1710 Acute hepatitis C without hepatic coma: Secondary | ICD-10-CM

## 2015-11-08 ENCOUNTER — Telehealth: Payer: Self-pay | Admitting: Pharmacist Clinician (PhC)/ Clinical Pharmacy Specialist

## 2015-11-08 NOTE — Telephone Encounter (Signed)
Tammy Barr called to see if she can restart her MVI. She finished this Monday. Told her it was fine. She has an appt with Dr Linus Salmons this month.

## 2015-11-21 ENCOUNTER — Ambulatory Visit (INDEPENDENT_AMBULATORY_CARE_PROVIDER_SITE_OTHER): Payer: Self-pay | Admitting: Internal Medicine

## 2015-11-21 ENCOUNTER — Ambulatory Visit: Payer: Self-pay | Admitting: Internal Medicine

## 2015-11-21 ENCOUNTER — Encounter: Payer: Self-pay | Admitting: Internal Medicine

## 2015-11-21 VITALS — BP 181/81 | HR 71 | Temp 97.8°F | Wt 162.0 lb

## 2015-11-21 DIAGNOSIS — B182 Chronic viral hepatitis C: Secondary | ICD-10-CM

## 2015-11-21 DIAGNOSIS — K7400 Hepatic fibrosis, unspecified: Secondary | ICD-10-CM

## 2015-11-21 NOTE — Progress Notes (Signed)
   Subjective:    Patient ID: Tammy Barr, female    DOB: Mar 11, 1961, 55 y.o.   MRN: [id number]  HPI Here for follow up of HCV.   Has genotype 1b and started and now completed Zepatier.  Had some increased fatigue during treatment and still with chronic fatigue and no singificant improvement.  Also interested in quitting smoking.  F2/3 on elastography.     Review of Systems  Constitutional: Positive for fatigue.  Skin: Negative for rash.  Neurological: Negative for dizziness and light-headedness.  Psychiatric/Behavioral: Positive for dysphoric mood.       Objective:   Physical Exam  Constitutional: She appears well-developed and well-nourished.  Eyes: No scleral icterus.  Cardiovascular: Normal rate, regular rhythm and normal heart sounds.   Pulmonary/Chest: Effort normal and breath sounds normal. No respiratory distress.  Skin: No rash noted.          Assessment & Plan:

## 2015-11-21 NOTE — Assessment & Plan Note (Signed)
Will check lab today to see if it has remained undetectable then again in 5 months when she returns to confirm cure.

## 2015-11-21 NOTE — Assessment & Plan Note (Signed)
No lab abnormalities and F2/3.  No indication for continued [place] screening.

## 2015-11-22 LAB — HEPATITIS C RNA QUANTITATIVE: HCV Quantitative: NOT DETECTED IU/mL (ref <15)

## 2015-11-23 ENCOUNTER — Encounter: Payer: Self-pay | Admitting: Internal Medicine

## 2015-11-23 ENCOUNTER — Ambulatory Visit (INDEPENDENT_AMBULATORY_CARE_PROVIDER_SITE_OTHER): Payer: Self-pay | Admitting: Internal Medicine

## 2015-11-23 VITALS — BP 137/89 | HR 61 | Temp 98.3°F | Ht 67.0 in | Wt 164.6 lb

## 2015-11-23 DIAGNOSIS — L84 Corns and callosities: Secondary | ICD-10-CM

## 2015-11-23 NOTE — Progress Notes (Signed)
   Subjective:    Patient ID: Tammy Barr, female    DOB: 1960-10-04, 55 y.o.   MRN: [id number]  HPI  55 yo F with HTN, Hep C fibrosis follows with ID s/p Hep C treatment, DM II, anxiety, bipolar, presents for follow up for foot callus  She has recurrent  Callus formation on both feet. Now she has callus on lateral aspect and also on the plantar aspect below her 1st MTP on right foot that's bothersome. Has not seen a podiatrist in the past, tried to remove this with a blade in the past on her own. Asked me to take this off and I told her that I am not trained to do that. Does not have insurance so hard to see podiatrist.   Denies any other problems.    Review of Systems  Constitutional: Negative for fever and chills.  HENT: Negative for congestion and sore throat.   Respiratory: Negative for cough and shortness of breath.   Musculoskeletal: Negative for myalgias and back pain.  Skin:       Callus on both feet       Objective:   Physical Exam  Constitutional: She is oriented to person, place, and time. She appears well-developed and well-nourished. No distress.  HENT:  Head: Normocephalic and atraumatic.  Eyes: Conjunctivae are normal. Right eye exhibits no discharge. Left eye exhibits no discharge. No scleral icterus.  Neck: Normal range of motion.  Cardiovascular: Normal rate, regular rhythm, S1 normal, S2 normal and normal heart sounds.  Exam reveals no gallop and no friction rub.   No murmur heard. Pulmonary/Chest: Effort normal and breath sounds normal. No respiratory distress. She has no wheezes. She has no rales. She exhibits no tenderness.  Abdominal: Soft. Bowel sounds are normal. She exhibits no distension. There is no tenderness.  Musculoskeletal: Normal range of motion. She exhibits no edema or tenderness.  callus on lateral aspect and also on the plantar aspect below her 1st MTP on right foot that's bothersome. Also has similar callus on medial and lateral aspect of  left foot.  No signs of infection.  Neurological: She is alert and oriented to person, place, and time. She has normal strength and normal reflexes. No cranial nerve deficit or sensory deficit.  Skin: She is not diaphoretic.  Psychiatric: She has a normal mood and affect.     Filed Vitals:   11/23/15 1010  BP: 137/89  Pulse: 61  Temp: 98.3 F (36.8 C)        Assessment & Plan:  See problem based a&p.

## 2015-11-23 NOTE — Assessment & Plan Note (Signed)
Has callus on both feet, L foot is more bothersome. Wanted me to remove it but I told her I am not trained to do that. Can't refer her to podiatry as she does not have insurance. There will be a free foot clinic in either July or August so I asked her to wait for that.  For now I asked her to buy Dr. Felicie Morn shoe insert to take load off her feet and to wear slippers. Asked her to avoid trying to cut this on her own as this would expose her to infection.

## 2015-11-23 NOTE — Patient Instructions (Signed)
With diabetes, it would be unsafe for your to try to remove the callus. It's best to see podiatrist for this.  You should buy Dr. Felicie Morn shoe inserts to take load off your feet and wear slippers until you can see a foot doctor.

## 2015-11-24 NOTE — Progress Notes (Signed)
Medicine attending: Medical history, presenting problems, physical findings, and medications, reviewed with resident physician Dr Tasrif Ahmed on the day of the patient visit and I concur with his evaluation and management plan. 

## 2016-01-12 ENCOUNTER — Encounter: Payer: Self-pay | Admitting: Internal Medicine

## 2016-01-12 ENCOUNTER — Ambulatory Visit (INDEPENDENT_AMBULATORY_CARE_PROVIDER_SITE_OTHER): Payer: Self-pay | Admitting: Internal Medicine

## 2016-01-12 DIAGNOSIS — K0889 Other specified disorders of teeth and supporting structures: Secondary | ICD-10-CM | POA: Insufficient documentation

## 2016-01-12 MED ORDER — AMOXICILLIN 500 MG PO TABS: 500.0000 mg | ORAL_TABLET | Freq: Two times a day (BID) | ORAL | 0 refills | Status: DC

## 2016-01-12 MED FILL — AMOXICILLIN 500 MG CAPSULE: 500 | 7 days supply | Qty: 14 | Fill #0

## 2016-01-12 NOTE — Patient Instructions (Addendum)
  Thank you for your visit today  Please take ibuprofen as needed for your pain (12 sample tablets have been provided)  Please follow up with your dentist  Please take the amoxicillin twice a day for 7 days

## 2016-01-12 NOTE — Progress Notes (Signed)
   CC: tooth pain referral HPI: [name] is a 55 y.o. woman with PMH noted below here for referral to dentist for tooth pain  Please see Problem List/A&P for the status of the patient's chronic medical problems   Past Medical History:  Diagnosis Date  . Anxiety   . Arthritis   . Bipolar 1 disorder ([place])   . Depression   . Diabetes mellitus type II   . Hyperlipidemia   . OCD (obsessive compulsive disorder)   . Polysubstance abuse    hx of  . Polysubstance abuse    remote  . Tobacco abuse     Review of Systems: Has subjective fevers but is afebrile in clinic. Has some headaches, and congestion associated with the tooth pain Says tooth pain present for few weeks Denies n/v  Physical Exam: Vitals:   01/12/16 1420  BP: (!) 168/96  Pulse: 65  Temp: 98 F (36.7 C)  TempSrc: Oral  SpO2: 100%  Weight: 164 lb 9.6 oz (74.7 kg)  Height: 5\' 7"  (1.702 m)    General: A&O, in NAD HEENT: Right molar tooth no surrounding erythema but possible some purulence CV: RRR, normal s1, s2, no m/r/g Resp: equal and symmetric breath sounds, no wheezing or crackles  Abdomen: soft, nontender, nondistended, +BS    Assessment & Plan:   See encounters tab for problem based medical decision making. Patient discussed with Dr. Evette Doffing

## 2016-01-12 NOTE — Assessment & Plan Note (Addendum)
Patient is present for right molar tooth pain for few weeks. Has subjective fevers, headaches, and some trouble biting. Thinks crown may need to be replaced. She does not think she has $30 copay for dental visit to [facility].   On exam, right molar tooth intact but possibly some purulence but no erythema or gingivitis. Vitals stable.  -P: prescribed amoxicillin 500 mg BID for 7 days Referred to dentistry -Her copay will be covered by our clinic

## 2016-01-16 NOTE — Progress Notes (Signed)
Internal Medicine Clinic Attending  I saw and evaluated the patient.  I personally confirmed the key portions of the history and exam documented by Dr. Tiburcio Pea and I reviewed pertinent patient test results.  The assessment, diagnosis, and plan were formulated together and I agree with the documentation in the resident's note.  The bottom right tooth does not have an obvious carrie, but there is purulence by the gum line with significant tenderness consistent with likely infection. Hopefully antibiotics can improve the pain and we can get her into dentistry for further management.

## 2016-02-14 ENCOUNTER — Encounter (INDEPENDENT_AMBULATORY_CARE_PROVIDER_SITE_OTHER): Payer: Self-pay | Admitting: Internal Medicine

## 2016-02-14 DIAGNOSIS — Z23 Encounter for immunization: Secondary | ICD-10-CM

## 2016-02-14 NOTE — Progress Notes (Signed)
Patient only here for flu vaccine. Refused appointment.

## 2016-02-26 ENCOUNTER — Ambulatory Visit: Payer: Self-pay

## 2016-04-08 ENCOUNTER — Ambulatory Visit (INDEPENDENT_AMBULATORY_CARE_PROVIDER_SITE_OTHER): Payer: Self-pay

## 2016-04-08 DIAGNOSIS — Z23 Encounter for immunization: Secondary | ICD-10-CM

## 2016-04-08 DIAGNOSIS — B182 Chronic viral hepatitis C: Secondary | ICD-10-CM

## 2016-04-22 ENCOUNTER — Encounter: Payer: Self-pay | Admitting: Internal Medicine

## 2016-04-22 ENCOUNTER — Ambulatory Visit (INDEPENDENT_AMBULATORY_CARE_PROVIDER_SITE_OTHER): Payer: Self-pay | Admitting: Internal Medicine

## 2016-04-22 DIAGNOSIS — K7400 Hepatic fibrosis, unspecified: Secondary | ICD-10-CM

## 2016-04-22 DIAGNOSIS — B182 Chronic viral hepatitis C: Secondary | ICD-10-CM

## 2016-04-22 NOTE — Assessment & Plan Note (Signed)
She wants to defer final test of cure until she gets blood tests again with her PCP who can check the HCV viral RNA.  If negative/undtectable, no further follow up needed.

## 2016-04-22 NOTE — Progress Notes (Signed)
   Subjective:    Patient ID: Tammy Barr, female    DOB: 05-21-1961, 55 y.o.   MRN: [id number]  HPI Here for follow up of HCV.   Has genotype 1b and started and now completed Zepatier over 5 months ago.  Here for SVR 24 but she is not "prepared" to have a blood test today.  She has noted some improvement in her fatigue from her baseline and pleased with this. F2/3 on elastography.     Review of Systems  Constitutional: Positive for fatigue.  Skin: Negative for rash.  Neurological: Negative for dizziness and light-headedness.  Psychiatric/Behavioral: Positive for dysphoric mood.       Objective:   Physical Exam  Constitutional: She appears well-developed and well-nourished.  Eyes: No scleral icterus.  Cardiovascular: Normal rate, regular rhythm and normal heart sounds.   Pulmonary/Chest: Effort normal and breath sounds normal. No respiratory distress.  Skin: No rash noted.          Assessment & Plan:

## 2016-04-22 NOTE — Assessment & Plan Note (Signed)
No HCC screening indicated.  Reminded her to remain alcohol free.

## 2016-05-28 ENCOUNTER — Telehealth: Payer: Self-pay | Admitting: Internal Medicine

## 2016-05-28 NOTE — Telephone Encounter (Signed)
PT CALLED BILL FOR DOS 04/2015 WAS SENT TO COLLECTIONS, ADVISED PT TO CALL PT [facility] [id number] AND ASK THEM TO BRING BACK FROM COLLETIONS B/C HER 100% DISCOUNT WAS IN PLACE FROM 03/07/15-09/05/16.

## 2016-07-11 ENCOUNTER — Other Ambulatory Visit: Payer: Self-pay | Admitting: *Deleted

## 2016-07-11 DIAGNOSIS — I10 Essential (primary) hypertension: Secondary | ICD-10-CM

## 2016-07-12 NOTE — Telephone Encounter (Signed)
Pt states she has Medicare/Medicaid now and will be able to afford meds. Aware she has an appt in April; has enough meds to last until then. Voiced no refills needed at this time.

## 2016-07-12 NOTE — Telephone Encounter (Signed)
Is she taking these medications? She has not followed up with me since she was started on these medications (07/2015) and has had consistently elevated BPs. She noted that she could not afford them at that time and was getting free samples. She has not had any labs drawn since starting them either. She needs to be seen in [facility] for BP recheck and labs.

## 2016-08-20 ENCOUNTER — Ambulatory Visit: Payer: Self-pay

## 2016-08-27 ENCOUNTER — Other Ambulatory Visit: Payer: Self-pay | Admitting: Internal Medicine

## 2016-08-27 ENCOUNTER — Encounter: Payer: Self-pay | Admitting: Internal Medicine

## 2016-08-27 DIAGNOSIS — I10 Essential (primary) hypertension: Secondary | ICD-10-CM

## 2016-08-27 MED ORDER — LISINOPRIL-HYDROCHLOROTHIAZIDE 10-12.5 MG PO TABS: 1.0000 | ORAL_TABLET | Freq: Every day | ORAL | 0 refills | Status: DC

## 2016-08-27 NOTE — Telephone Encounter (Signed)
Since all were cancellations, she has not been sent letters. I have letter ready to go. If she no shows, we may dismiss. She has uncontrolled HTN so does need to be seen

## 2016-08-27 NOTE — Telephone Encounter (Signed)
lisinopril-hydrochlorothiazide (PRINZIDE,ZESTORETIC) 10-12.5 MG tablet [employer]

## 2016-08-27 NOTE — Telephone Encounter (Signed)
Patient appears to have cancelled her appointment with me tomorrow and reschedule to 10/02/16. She has a history of frequent cancellation and I have yet to see the patient again since our initial visit 03/2016. She has only been seen in clinic twice in the past year for acute care visits. I will refill her prescription to get her to our May appointment but she must be seen at that time. Will forward to Dr. Lynnae January.

## 2016-08-28 ENCOUNTER — Telehealth: Payer: Self-pay | Admitting: Internal Medicine

## 2016-08-28 ENCOUNTER — Encounter: Payer: Self-pay | Admitting: Internal Medicine

## 2016-08-28 NOTE — Telephone Encounter (Signed)
Pt calling about med Lisinopril-Hydrochlorothiazide Previous note is closed.

## 2016-08-28 NOTE — Telephone Encounter (Signed)
Pt was called and informed she would need to keep her next appt, med was cancelled at [facility] and called to [facility]

## 2016-08-28 NOTE — Telephone Encounter (Signed)
Pt states med is not at the pharmacy.. Please call pt back.

## 2016-09-09 NOTE — Telephone Encounter (Signed)
This was addressed by dr's butcher and boswell

## 2016-09-18 ENCOUNTER — Ambulatory Visit (HOSPITAL_COMMUNITY)
Admission: EM | Admit: 2016-09-18 | Discharge: 2016-09-18 | Disposition: A | Discharge status: Home / Self Care | Payer: Medicare Other | Attending: Family Medicine | Admitting: Family Medicine

## 2016-09-18 ENCOUNTER — Encounter (HOSPITAL_COMMUNITY): Payer: Self-pay | Admitting: Emergency Medicine

## 2016-09-18 DIAGNOSIS — K2970 Gastritis, unspecified, without bleeding: Secondary | ICD-10-CM | POA: Diagnosis not present

## 2016-09-18 MED ORDER — ONDANSETRON 4 MG PO TBDP
8.0000 mg | ORAL_TABLET | Freq: Once | ORAL | Status: AC
  Administered 2016-09-18: 8 mg via ORAL

## 2016-09-18 MED ORDER — ONDANSETRON 4 MG PO TBDP
ORAL_TABLET | ORAL | Status: AC
  Filled 2016-09-18: qty 2

## 2016-09-18 MED ORDER — ONDANSETRON 4 MG PO TBDP: 4.0000 mg | ORAL_TABLET | Freq: Three times a day (TID) | ORAL | 0 refills | Status: DC | PRN

## 2016-09-18 NOTE — ED Triage Notes (Signed)
Nausea, vomiting and dizziness started last night.  Denies abdominal pain or diarrhea

## 2016-09-18 NOTE — Discharge Instructions (Signed)
I have given use of Zofran here today for your gastritis, and I sent a prescription into your pharmacy. Recommended clear liquids for the next 24 hours such as beef broth, chicken broth, vegetable broth, Jell-O, Gatorade, water. After that you may try simple foods such as bananas, rice, toast, and if you're able to tolerate these and proceed to a regular diet.
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
## 2016-10-02 ENCOUNTER — Encounter: Payer: Self-pay | Admitting: Internal Medicine

## 2016-10-02 ENCOUNTER — Ambulatory Visit (INDEPENDENT_AMBULATORY_CARE_PROVIDER_SITE_OTHER): Payer: Medicare Other | Admitting: Internal Medicine

## 2016-10-02 VITALS — BP 127/80 | HR 71 | Temp 98.4°F | Ht 67.0 in | Wt 153.6 lb

## 2016-10-02 DIAGNOSIS — F17210 Nicotine dependence, cigarettes, uncomplicated: Secondary | ICD-10-CM

## 2016-10-02 DIAGNOSIS — I10 Essential (primary) hypertension: Secondary | ICD-10-CM | POA: Diagnosis not present

## 2016-10-02 DIAGNOSIS — Z0000 Encounter for general adult medical examination without abnormal findings: Secondary | ICD-10-CM

## 2016-10-02 DIAGNOSIS — Z720 Tobacco use: Secondary | ICD-10-CM

## 2016-10-02 DIAGNOSIS — E119 Type 2 diabetes mellitus without complications: Secondary | ICD-10-CM | POA: Diagnosis present

## 2016-10-02 DIAGNOSIS — F1120 Opioid dependence, uncomplicated: Secondary | ICD-10-CM

## 2016-10-02 DIAGNOSIS — F1111 Opioid abuse, in remission: Secondary | ICD-10-CM

## 2016-10-02 DIAGNOSIS — B182 Chronic viral hepatitis C: Secondary | ICD-10-CM | POA: Diagnosis not present

## 2016-10-02 LAB — POCT GLYCOSYLATED HEMOGLOBIN (HGB A1C): Hemoglobin A1C: 5.5

## 2016-10-02 LAB — GLUCOSE, CAPILLARY: Glucose-Capillary: 130 mg/dL — ABNORMAL HIGH (ref 65–99)

## 2016-10-02 MED ORDER — GABAPENTIN 100 MG PO CAPS: 100.0000 mg | ORAL_CAPSULE | Freq: Three times a day (TID) | ORAL | 2 refills | Status: DC

## 2016-10-02 MED ORDER — LISINOPRIL-HYDROCHLOROTHIAZIDE 10-12.5 MG PO TABS: 1.0000 | ORAL_TABLET | Freq: Every day | ORAL | 2 refills | Status: DC

## 2016-10-02 MED ORDER — VARENICLINE TARTRATE 0.5 MG X 11 & 1 MG X 42 PO MISC
ORAL | 0 refills | Status: DC
Start: 2016-10-02 — End: 2017-01-01

## 2016-10-02 MED FILL — GABAPENTIN 100 MG CAPSULE: 100 | 30 days supply | Qty: 90 | Fill #0

## 2016-10-02 NOTE — Assessment & Plan Note (Addendum)
BP Readings from Last 3 Encounters:  10/02/16 127/80  09/18/16 136/86  04/22/16 (!) 148/94    Lab Results  Component Value Date   NA 139 10/05/2015   K 4.1 10/05/2015   CREATININE 0.78 10/05/2015   BP today 127/80.  Reports being off the lisionpril-hctz for approximately 9 months but cannot recall very well. Believes she has been taking it again consistently again since October. Says her BP got so high (reports 314H systolic at urgent care) that she thought she was going to have a stroke and started taking it again.  Today, she denies any headaches, acute vision changes. Does report occasional lightheadedness/dizziness late at night. Occurs with standing for long periods but not with position change.   Assessment:  Plan: Continue current medications BMET today unremarkable

## 2016-10-02 NOTE — Patient Instructions (Signed)
Ms. Boehlke,  It was a pleasure seeing you again today. Please start the gabapentin and chantix as we discussed.  I would like to see you back in August for follow up.

## 2016-10-02 NOTE — Assessment & Plan Note (Addendum)
Reports she is smoking one pack a day. She reports starting Wellbutrin at [facility] and going from 2 cigarettes a day with a vaporizer to a pack per day now. No history of seizures. Reports trying patches, gum, vapor, using quit line. She is interested in trying chantix, says it worked for her for 5 years in the past.   Plan Start chanix today

## 2016-10-02 NOTE — Progress Notes (Signed)
   CC: HTN follow up  HPI:  [name] is a 56 y.o. female with a past medical history listed below here today for follow up of her HTN.  For details of today's visit and the status of her chronic medical issues please refer to the assessment and plan.   Past Medical History:  Diagnosis Date  . Anxiety   . Arthritis   . Bipolar 1 disorder ([place])   . Depression   . Diabetes mellitus type II   . Hyperlipidemia   . OCD (obsessive compulsive disorder)   . Polysubstance abuse    hx of  . Polysubstance abuse    remote  . Tobacco abuse     Review of Systems:   No chest pain or shortness of breath  Physical Exam:  Vitals:   10/02/16 1515  BP: 127/80  Pulse: 71  Temp: 98.4 F (36.9 C)  TempSrc: Oral  SpO2: 98%  Weight: 153 lb 9.6 oz (69.7 kg)  Height: 5\' 7"  (1.702 m)   Physical Exam  Constitutional: She is oriented to person, place, and time and well-developed, well-nourished, and in no distress. No distress.  Cardiovascular: Normal rate and regular rhythm.   Pulmonary/Chest: Effort normal and breath sounds normal.  Abdominal: Soft. Bowel sounds are normal.  Neurological: She is alert and oriented to person, place, and time.  Skin: Skin is warm and dry.  Psychiatric: Mood and affect normal.     Assessment & Plan:   See Encounters Tab for problem based charting.  Patient discussed with Dr. Angelia Mould

## 2016-10-03 ENCOUNTER — Other Ambulatory Visit: Payer: Self-pay

## 2016-10-03 LAB — BMP8+ANION GAP
Anion Gap: 18 mmol/L (ref 10.0–18.0)
BUN/Creatinine Ratio: 19 (ref 9–23)
BUN: 13 mg/dL (ref 6–24)
CO2: 25 mmol/L (ref 18–29)
CREATININE: 0.69 mg/dL (ref 0.57–1.00)
Calcium: 9.7 mg/dL (ref 8.7–10.2)
Chloride: 98 mmol/L (ref 96–106)
GFR, EST AFRICAN AMERICAN: 113 mL/min/{1.73_m2} (ref >59)
GFR, EST NON AFRICAN AMERICAN: 98 mL/min/{1.73_m2} (ref >59)
Glucose: 87 mg/dL (ref 65–99)
POTASSIUM: 4.4 mmol/L (ref 3.5–5.2)
SODIUM: 141 mmol/L (ref 134–144)

## 2016-10-03 NOTE — Telephone Encounter (Signed)
varenicline (CHANTIX STARTING MONTH PAK) 0.5 MG X 11 & 1 MG X 42 tablet, is not at the pharmacy. Pt is using [facility].

## 2016-10-04 LAB — HCV RNA QUANT: Hepatitis C Quantitation: NOT DETECTED IU/mL

## 2016-10-04 NOTE — Telephone Encounter (Signed)
Chantix called in to [facility].

## 2016-10-07 DIAGNOSIS — F1121 Opioid dependence, in remission: Secondary | ICD-10-CM | POA: Insufficient documentation

## 2016-10-07 NOTE — Assessment & Plan Note (Addendum)
Lab Results  Component Value Date   HGBA1C 5.5 10/02/2016   HGBA1C 5.6 03/27/2015   HGBA1C 5.6 03/26/2010    Diet controlled. A1c 5.5 today.   Plan: Monitor annually Refer for DM eye exam

## 2016-10-07 NOTE — Assessment & Plan Note (Signed)
HCV viral RNA today was negative. HCV cured.

## 2016-10-07 NOTE — Assessment & Plan Note (Signed)
Reports that she has recently started being seen at [facility] and started suboxone. She reports currently being on Suboxone 8-2 mg tid. She says that she had previously been taking left over oxycodone from her late 62 old prescriptions from surgeries for her chronic pain. Doing well on the suboxone and denies any withdrawal symptoms.  Plan Follow up with suboxone clinic

## 2016-10-07 NOTE — Assessment & Plan Note (Addendum)
Declines mammogram testing as it is too painful Declines colonoscopy but agreeable to FIT testing. Unfortunately left without the kit today. Will try at next visit.

## 2016-10-08 NOTE — Progress Notes (Signed)
Internal Medicine Clinic Attending  Case discussed with Dr. Boswell at the time of the visit.  We reviewed the resident's history and exam and pertinent patient test results.  I agree with the assessment, diagnosis, and plan of care documented in the resident's note.  

## 2016-10-09 ENCOUNTER — Encounter: Payer: Self-pay | Admitting: Internal Medicine

## 2016-10-09 ENCOUNTER — Telehealth: Payer: Self-pay

## 2016-10-09 MED ORDER — ONDANSETRON HCL 4 MG PO TABS: 4.0000 mg | ORAL_TABLET | Freq: Every day | ORAL | 1 refills | Status: DC | PRN

## 2016-10-09 NOTE — Telephone Encounter (Signed)
Spoke with patient about her results. Updated her medication list.   She would like to move her appointment with me to 12/25/16 at 1:45 instead of 01/01/17.

## 2016-10-09 NOTE — Telephone Encounter (Signed)
Patient called requesting results from her physical and lab work also states that zofran was not on her medication list and she needs this medication. Informed patient that I would would concerns to PCP and some one will return her call

## 2016-10-09 NOTE — Progress Notes (Signed)
Your Hepatitis C has been treated and you are cured. This is great news and you do not need to go back to see the infectious disease doctors anymore.   The rest of your labs looked great and there are no abnormalities. I will see you back in August or sooner if you have any issues.

## 2016-10-09 NOTE — Telephone Encounter (Signed)
Needs to speak with a nurse about meds and lab result. Please call back.

## 2016-10-23 ENCOUNTER — Ambulatory Visit (INDEPENDENT_AMBULATORY_CARE_PROVIDER_SITE_OTHER): Payer: Medicare Other | Admitting: Internal Medicine

## 2016-10-23 ENCOUNTER — Encounter: Payer: Self-pay | Admitting: Internal Medicine

## 2016-10-23 ENCOUNTER — Encounter: Payer: Self-pay | Admitting: Pharmacist

## 2016-10-23 VITALS — BP 131/73 | HR 75 | Temp 98.3°F | Ht 67.0 in | Wt 156.8 lb

## 2016-10-23 DIAGNOSIS — M792 Neuralgia and neuritis, unspecified: Secondary | ICD-10-CM

## 2016-10-23 DIAGNOSIS — R600 Localized edema: Secondary | ICD-10-CM | POA: Diagnosis not present

## 2016-10-23 DIAGNOSIS — F17210 Nicotine dependence, cigarettes, uncomplicated: Secondary | ICD-10-CM

## 2016-10-23 DIAGNOSIS — M7989 Other specified soft tissue disorders: Secondary | ICD-10-CM | POA: Insufficient documentation

## 2016-10-23 MED ORDER — PREGABALIN 50 MG PO CAPS: 50.0000 mg | ORAL_CAPSULE | Freq: Three times a day (TID) | ORAL | 2 refills | Status: DC

## 2016-10-23 MED ORDER — DULOXETINE HCL 60 MG PO CPEP: 60.0000 mg | ORAL_CAPSULE | Freq: Every day | ORAL | 2 refills | Status: DC

## 2016-10-23 NOTE — Progress Notes (Signed)
Chantix letter of medical necessity faxed to insurance per letter patient received for refusal of coverage unless otherwise requested by physician.

## 2016-10-23 NOTE — Assessment & Plan Note (Signed)
Having b/l lower ext swelling for 2 weeks since starting gabapentin. No SOB to suggest heart failure. This could be 2/2 to gabapentin (incidence rate 1-4% per uptodate)  Will stop gabapentin Asked to reduce salt intake Compression stocking No ECHO for now.

## 2016-10-23 NOTE — Progress Notes (Signed)
   CC: swelling of both feet x2 weeks  HPI:  Tammy Barr is a 56 y.o. with pmh as listed below is here for swelling of both beet for 2 weeks after starting gabapentin Both feet are swollen, no SOB, no chest pain, no rash or any other side effects. Has    Past Medical History:  Diagnosis Date  . Anxiety   . Arthritis   . Bipolar 1 disorder ([place])   . Depression   . Diabetes mellitus type II   . Hyperlipidemia   . OCD (obsessive compulsive disorder)   . Polysubstance abuse    hx of  . Polysubstance abuse    remote  . Tobacco abuse     Review of Systems:   Review of Systems  Constitutional: Negative for chills and fever.  Respiratory: Negative for cough and shortness of breath.   Cardiovascular: Positive for leg swelling. Negative for chest pain and palpitations.  Gastrointestinal: Negative for nausea and vomiting.  Neurological: Negative for dizziness and headaches.     Physical Exam:  Vitals:   10/23/16 0954  BP: 131/73  Pulse: 75  Temp: 98.3 F (36.8 C)  TempSrc: Oral  SpO2: 97%  Weight: 156 lb 12.8 oz (71.1 kg)  Height: 5\' 7"  (1.702 m)   Physical Exam  Constitutional: She appears well-developed and well-nourished. No distress.  HENT:  Head: Normocephalic and atraumatic.  Cardiovascular: Normal rate and regular rhythm.  Exam reveals no gallop and no friction rub.   No murmur heard. Respiratory: Effort normal and breath sounds normal. No respiratory distress. She has no wheezes. She has no rales.  Musculoskeletal: Normal range of motion.  2+ pitting edema upto shins b/l. No erythema or warmth or calf tenderness.   Skin: She is not diaphoretic.    Assessment & Plan:   See Encounters Tab for problem based charting.  Patient discussed with Dr. Angelia Mould

## 2016-10-23 NOTE — Assessment & Plan Note (Signed)
Her neuropathic pain has not improved on gabapentin. She is actually having lower ext edema since starting gabapentin.  Stop gabapentin Switch to lyrica 50 mg TID

## 2016-10-23 NOTE — Progress Notes (Signed)
Internal Medicine Clinic Attending  Case discussed with Dr. Ahmed at the time of the visit.  We reviewed the resident's history and exam and pertinent patient test results.  I agree with the assessment, diagnosis, and plan of care documented in the resident's note. 

## 2016-10-23 NOTE — Patient Instructions (Addendum)
Start taking Lyrica for your neuropathic pain.  Stop the gabapentin  Wear the compression stocking, keep your feet elevated.  Follow up as needed.

## 2016-10-24 ENCOUNTER — Telehealth: Payer: Self-pay | Admitting: *Deleted

## 2016-10-24 MED FILL — LYRICA 50 MG CAPSULE: 50 | 30 days supply | Qty: 90 | Fill #0

## 2016-10-24 NOTE — Telephone Encounter (Signed)
Call to patient to discuss getting her Chantix.  Patient not clear if she can get her Chantix-only thought that she was limited to the starter pack.  Fax from [employer] [facility] pt is approved for medication 08/25/2016 thru 04/21/2017.  Patient was called and informed that the Chantix had been approvedd until 04/21/2017.  Sander Nephew, RN 10/24/2016 2:03 PM.

## 2016-10-29 LAB — HM DIABETES EYE EXAM

## 2016-10-30 ENCOUNTER — Encounter: Payer: Self-pay | Admitting: Internal Medicine

## 2016-10-30 ENCOUNTER — Ambulatory Visit (INDEPENDENT_AMBULATORY_CARE_PROVIDER_SITE_OTHER): Payer: Medicare Other | Admitting: Internal Medicine

## 2016-10-30 VITALS — BP 132/85 | HR 74 | Temp 98.3°F | Wt 157.8 lb

## 2016-10-30 DIAGNOSIS — R600 Localized edema: Secondary | ICD-10-CM

## 2016-10-30 DIAGNOSIS — E11628 Type 2 diabetes mellitus with other skin complications: Secondary | ICD-10-CM | POA: Insufficient documentation

## 2016-10-30 DIAGNOSIS — L84 Corns and callosities: Secondary | ICD-10-CM

## 2016-10-30 DIAGNOSIS — F17210 Nicotine dependence, cigarettes, uncomplicated: Secondary | ICD-10-CM | POA: Diagnosis not present

## 2016-10-30 DIAGNOSIS — M792 Neuralgia and neuritis, unspecified: Secondary | ICD-10-CM

## 2016-10-30 DIAGNOSIS — I10 Essential (primary) hypertension: Secondary | ICD-10-CM

## 2016-10-30 MED ORDER — FUROSEMIDE 20 MG PO TABS: 20.0000 mg | ORAL_TABLET | Freq: Every day | ORAL | 0 refills | Status: DC | PRN

## 2016-10-30 MED FILL — FUROSEMIDE 20 MG TABLET: 20 | 14 days supply | Qty: 14 | Fill #0

## 2016-10-30 NOTE — Assessment & Plan Note (Addendum)
She was seen in clinic last week which time she reported swelling of both feet for the past 2 weeks after starting gabapentin for neuropathic pain. At that time she was documented to have 2+ symmetric pitting edema to mid shin with no signs of DVT or infection.  Persistent symmetric lower extremity edema without signs of DVT or infection. I doubt heart failure, she has no pulmonary edema, orthopnea, PND, or change in exertional capacity. She could have isolated right heart failure, but this seems unlikely at this time. The symmetric nature of her edema makes DVT and cellulitis unlikely. No hyperpigmentation to suggest chronic venous insufficiency.  Hypoalbuminemia is a possible etiology that has not been investigated.  -Check UA and CMP -Lasix 20 mg daily when necessary for edema -Wear compression stockings during the day as much as possible  ADDENDUM 10/31/2016  Urinalysis    Component Value Date/Time   COLORURINE LT. YELLOW 06/24/2011 1034   APPEARANCEUR Clear 10/30/2016 0938   LABSPEC 1.020 06/24/2011 1034   PHURINE 6.0 06/24/2011 1034   GLUCOSEU Negative 10/30/2016 0938   GLUCOSEU NEGATIVE 06/24/2011 1034   HGBUR NEGATIVE 06/24/2011 1034   BILIRUBINUR Negative 10/30/2016 0938   KETONESUR NEGATIVE 06/24/2011 1034   PROTEINUR Negative 10/30/2016 0938   PROTEINUR NEGATIVE 10/31/2009 0515   UROBILINOGEN 0.2 06/24/2011 1034   NITRITE Negative 10/30/2016 0938   NITRITE NEGATIVE 06/24/2011 1034   LEUKOCYTESUR 2+ (A) 10/30/2016 0938    CMP Latest Ref Rng & Units 10/30/2016 10/02/2016 10/05/2015  Glucose 65 - 99 mg/dL 86 87 69  BUN 6 - 24 mg/dL 15 13 13   Creatinine 0.57 - 1.00 mg/dL 0.62 0.69 0.78  Sodium 134 - 144 mmol/L 141 141 139  Potassium 3.5 - 5.2 mmol/L 3.9 4.4 4.1  Chloride 96 - 106 mmol/L 101 98 105  CO2 18 - 29 mmol/L 25 25 26   Calcium 8.7 - 10.2 mg/dL 9.3 9.7 9.3  Total Protein 6.0 - 8.5 g/dL 7.0 - 6.7  Total Bilirubin 0.0 - 1.2 mg/dL <0.2 - 0.5  Alkaline Phos 39 - 117 IU/L  84 - 75  AST 0 - 40 IU/L 15 - 16  ALT 0 - 32 IU/L 11 - 14   No proteinuria or hypoalbuminemia.

## 2016-10-30 NOTE — Progress Notes (Signed)
Internal Medicine Clinic Attending  Case discussed with Dr. O'Sullivan at the time of the visit.  We reviewed the resident's history and exam and pertinent patient test results.  I agree with the assessment, diagnosis, and plan of care documented in the resident's note. 

## 2016-10-30 NOTE — Assessment & Plan Note (Signed)
Heavy callus over head of the first metatarsal right worse than left.  -Referred to podiatry for debridement and diabetic foot care

## 2016-10-30 NOTE — Assessment & Plan Note (Signed)
She was instructed to stop taking gabapentin last week with concern that her lower extremity edema could be an adverse effects of medicine. Since that time she has been taking Lyrica twice daily, but doesn't think it is been helping her pain.  -Increase Lyrica to 50 mg 3 times a day as initially prescribed -Uptitrate to 100 mg 3 times a day if needed at follow-up

## 2016-10-30 NOTE — Patient Instructions (Signed)
For your leg swelling, please try and wear compression stockings as much as possible when you are up on your feet.  They do the most good during the day when you're walking around rather than at night.  I have prescribed you Lasix, a fluid pill, to take once per day as needed for swelling. If your leg swelling goes down, please stop taking it.  For your pain, cough to taking the Lyrica 3 times per day as prescribed.  Come back for another appointment in 2 weeks to check on your swelling.

## 2016-10-30 NOTE — Progress Notes (Signed)
   CC: "My legs are still swollen."  HPI:  Tammy Barr is a 56 y.o. woman with history of diet controlled DM and HTN who presents for follow-up of lower extremity edema.  Please see A&P for status of the patient's chronic medical conditions.  Past Medical History:  Diagnosis Date  . Anxiety   . Arthritis   . Bipolar 1 disorder ([place])   . Depression   . Diabetes mellitus type II   . Hyperlipidemia   . OCD (obsessive compulsive disorder)   . Polysubstance abuse    hx of  . Polysubstance abuse    remote  . Tobacco abuse     Review of Systems:   Review of Systems  Respiratory: Negative for cough and shortness of breath.   Cardiovascular: Positive for leg swelling. Negative for chest pain, palpitations, orthopnea and PND.  Genitourinary: Negative for dysuria and frequency.     Physical Exam:  Vitals:   10/30/16 0902  BP: 132/85  Pulse: 74  Temp: 98.3 F (36.8 C)  TempSrc: Oral  SpO2: 100%  Weight: 157 lb 12.8 oz (71.6 kg)   Physical Exam  Constitutional: She is oriented to person, place, and time. She appears well-developed and well-nourished. No distress.  Cardiovascular: Normal rate and regular rhythm.   Pulmonary/Chest: Effort normal and breath sounds normal. She has no rales.  Musculoskeletal:  2+ pitting edema to mid shin on right 1-2+ pitting edema to mid shin on left No posterior calf tenderness or cord sign  Neurological: She is alert and oriented to person, place, and time.  Skin:  No erythema, hyperpigmentation, or wounds bilateral legs Heavy callus over the head of the first metatarsal bilaterally right worse than left  Psychiatric: She has a normal mood and affect. Her behavior is normal.    Assessment & Plan:   See Encounters Tab for problem based charting.  Patient discussed with Dr. Dareen Piano

## 2016-10-31 ENCOUNTER — Encounter: Payer: Self-pay | Admitting: Internal Medicine

## 2016-10-31 LAB — CMP14 + ANION GAP
ALBUMIN: 4.4 g/dL (ref 3.5–5.5)
ALT: 11 IU/L (ref 0–32)
AST: 15 IU/L (ref 0–40)
Albumin/Globulin Ratio: 1.7 (ref 1.2–2.2)
Alkaline Phosphatase: 84 IU/L (ref 39–117)
Anion Gap: 15 mmol/L (ref 10.0–18.0)
BUN / CREAT RATIO: 24 — AB (ref 9–23)
BUN: 15 mg/dL (ref 6–24)
CALCIUM: 9.3 mg/dL (ref 8.7–10.2)
CHLORIDE: 101 mmol/L (ref 96–106)
CO2: 25 mmol/L (ref 18–29)
Creatinine, Ser: 0.62 mg/dL (ref 0.57–1.00)
GFR calc non Af Amer: 100 mL/min/{1.73_m2} (ref >59)
GFR, EST AFRICAN AMERICAN: 116 mL/min/{1.73_m2} (ref >59)
GLUCOSE: 86 mg/dL (ref 65–99)
Globulin, Total: 2.6 g/dL (ref 1.5–4.5)
Potassium: 3.9 mmol/L (ref 3.5–5.2)
Sodium: 141 mmol/L (ref 134–144)
TOTAL PROTEIN: 7 g/dL (ref 6.0–8.5)

## 2016-10-31 LAB — URINALYSIS, ROUTINE W REFLEX MICROSCOPIC
Bilirubin, UA: NEGATIVE
GLUCOSE, UA: NEGATIVE
Ketones, UA: NEGATIVE
NITRITE UA: NEGATIVE
PH UA: 5.5 (ref 5.0–7.5)
Protein, UA: NEGATIVE
RBC, UA: NEGATIVE
Specific Gravity, UA: 1.015 (ref 1.005–1.030)
UUROB: 0.2 mg/dL (ref 0.2–1.0)

## 2016-10-31 LAB — MICROSCOPIC EXAMINATION: Casts: NONE SEEN /lpf

## 2016-11-13 ENCOUNTER — Ambulatory Visit (INDEPENDENT_AMBULATORY_CARE_PROVIDER_SITE_OTHER): Payer: Medicare Other | Admitting: Internal Medicine

## 2016-11-13 ENCOUNTER — Telehealth: Payer: Self-pay | Admitting: *Deleted

## 2016-11-13 VITALS — BP 114/68 | HR 64 | Temp 98.1°F | Wt 160.5 lb

## 2016-11-13 DIAGNOSIS — R600 Localized edema: Secondary | ICD-10-CM | POA: Diagnosis not present

## 2016-11-13 DIAGNOSIS — F17210 Nicotine dependence, cigarettes, uncomplicated: Secondary | ICD-10-CM | POA: Diagnosis not present

## 2016-11-13 DIAGNOSIS — R05 Cough: Secondary | ICD-10-CM | POA: Diagnosis not present

## 2016-11-13 DIAGNOSIS — Z720 Tobacco use: Secondary | ICD-10-CM

## 2016-11-13 MED ORDER — FUROSEMIDE 40 MG PO TABS: 40.0000 mg | ORAL_TABLET | Freq: Every day | ORAL | 0 refills | Status: DC

## 2016-11-13 NOTE — Progress Notes (Signed)
   CC: Patient is here for a follow-up of her bilateral lower extremity edema.  HPI:  [name] is a 56 y.o. female with a past medical history of conditions listed below presenting to the clinic for a follow-up of her bilateral lower extremity edema. Please see problem based charting for the status of the patient's current and chronic medical conditions.   Past Medical History:  Diagnosis Date  . Anxiety   . Arthritis   . Bipolar 1 disorder ([place])   . Depression   . Diabetes mellitus type II   . Hyperlipidemia   . OCD (obsessive compulsive disorder)   . Polysubstance abuse    hx of  . Polysubstance abuse    remote  . Tobacco abuse     Review of Systems: Pertinent positives mentioned in HPI. Remainder of all ROS negative.   Physical Exam:  Vitals:   11/13/16 0942  BP: 114/68  Pulse: 64  Temp: 98.1 F (36.7 C)  TempSrc: Oral  SpO2: 100%  Weight: 160 lb 8 oz (72.8 kg)   Physical Exam  Constitutional: She is oriented to person, place, and time. She appears well-developed and well-nourished. No distress.  HENT:  Head: Normocephalic and atraumatic.  Eyes: Right eye exhibits no discharge. Left eye exhibits no discharge.  Cardiovascular: Normal rate, regular rhythm and intact distal pulses.   Pulmonary/Chest: Effort normal and breath sounds normal. No respiratory distress. She has no wheezes. She has no rales.  Abdominal: Soft. Bowel sounds are normal. She exhibits no distension. There is no tenderness.  Musculoskeletal: She exhibits edema.  +1 to +2 symmetric pitting bilateral lower extremity edema up to mid shin level. Skin appears mildly erythematous in these areas. Calf muscle is nontender to palpation bilaterally.  Neurological: She is alert and oriented to person, place, and time.  Skin: Skin is warm and dry.    Assessment & Plan:   See Encounters Tab for problem based charting.  Patient discussed with Dr. Evette Doffing

## 2016-11-13 NOTE — Patient Instructions (Signed)
Ms. Verley it was nice meeting you today.  -Take Lasix 40 mg daily for the next 7 days  -Please wear compression stockings  -Return to the clinic in 7-10 days

## 2016-11-13 NOTE — Telephone Encounter (Signed)
Pt requesting chantix refill-has almost completed her starter pack.  Will send to pcp for refill, please advise.Regenia Skeeter, Macai Sisneros Cassady6/20/201811:29 AM

## 2016-11-13 NOTE — Assessment & Plan Note (Addendum)
History of present illness Patient was last seen 2 weeks ago for her bilateral lower extremity edema and prescribed Lasix 20 mg daily. States she has finished taking Lasix but continues to have edema in her legs. She is also complaining of cramps in the legs. Denies having any shortness of breath. Does report having a chronic cough and she is a current smoker. Reports traveling to [place] on May 23 which was a 6 hour bus ride. States the bus stopped 2-3 times on the way and she ambulated during those breaks. Denies having any calf pain.  Assessment Unclear etiology of her symmetric bilateral lower extremity edema. Differentials include venous stasis versus possible right-sided heart failure. Patient does not have a history of congestive heart failure and there is no echocardiogram result in her chart. Her lungs are clear in exam, however, her symptoms could likely be due to right-sided heart failure. At present, she has +1 to +2 bilateral lower extremity pitting edema up to mid shin level and weight is up by 3 pounds in the past 2 weeks. Skin in these areas appears mildly erythematous but the patient has no tenderness on palpation of her calves. She did travel recently but DVT less likely as her lower extremity edema is symmetric and she does not have any calf pain. Workup done during her previous visit was negative for proteinuria and hypoalbuminemia. She is not currently on a dihydropyridine calcium channel blocker.  Plan -Echo has been ordered to look for pulmonary hypertension which can be an etiology of right-sided heart failure -Dose of Lasix has been increased to 40 mg daily 7 days -Compression stockings -BMP to check for hypokalemia -Patient has been advised to return to the clinic next week after she finishes her course of Lasix  Addendum 11/14/2016: BMP showing normal potassium and creatinine 0.7. Spoke to the patient over the phone and advised her to continue taking Lasix.  Addendum  11/26/2016: B/l LE edema likely 2/2 venous stasis as echo done 11/25/2016 showing normal EF and negative for pulmonary HTN. Echo findings - LVEF 60-65% and PA peak pressure 17 mmHg.  Tried calling the patient but could not reach her over the phone.

## 2016-11-14 ENCOUNTER — Other Ambulatory Visit: Payer: Self-pay

## 2016-11-14 LAB — BMP8+ANION GAP
ANION GAP: 15 mmol/L (ref 10.0–18.0)
BUN/Creatinine Ratio: 20 (ref 9–23)
BUN: 15 mg/dL (ref 6–24)
CHLORIDE: 96 mmol/L (ref 96–106)
CO2: 26 mmol/L (ref 20–29)
Calcium: 9.6 mg/dL (ref 8.7–10.2)
Creatinine, Ser: 0.75 mg/dL (ref 0.57–1.00)
GFR calc Af Amer: 102 mL/min/{1.73_m2} (ref >59)
GFR calc non Af Amer: 89 mL/min/{1.73_m2} (ref >59)
GLUCOSE: 102 mg/dL — AB (ref 65–99)
POTASSIUM: 4.4 mmol/L (ref 3.5–5.2)
Sodium: 137 mmol/L (ref 134–144)

## 2016-11-14 MED ORDER — VARENICLINE TARTRATE 1 MG PO TABS: 1.0000 mg | ORAL_TABLET | Freq: Two times a day (BID) | ORAL | 0 refills | Status: DC

## 2016-11-14 NOTE — Telephone Encounter (Signed)
varenicline (CHANTIX) 1 MG tablet, refill request @ [facility].

## 2016-11-14 NOTE — Progress Notes (Signed)
Internal Medicine Clinic Attending  Case discussed with Dr. Rathoreat the time of the visit. We reviewed the resident's history and exam and pertinent patient test results. I agree with the assessment, diagnosis, and plan of care documented in the resident's note.  

## 2016-11-18 NOTE — Telephone Encounter (Signed)
Rx previously approved on 11/14/2016, but sent to [facility] and pt requested rx be sent to [facility]) pharmacy.  Rx phoned into [facility].  No further action needed, phone call complete.Despina Hidden Cassady6/25/20183:14 PM

## 2016-11-20 ENCOUNTER — Ambulatory Visit: Payer: Medicare Other

## 2016-11-25 ENCOUNTER — Ambulatory Visit (HOSPITAL_COMMUNITY)
Admission: RE | Admit: 2016-11-25 | Discharge: 2016-11-25 | Disposition: A | Discharge status: Home / Self Care | Payer: Medicare Other | Source: Ambulatory Visit | Attending: Student in an Organized Health Care Education/Training Program | Admitting: Student in an Organized Health Care Education/Training Program

## 2016-11-25 DIAGNOSIS — I517 Cardiomegaly: Secondary | ICD-10-CM | POA: Diagnosis not present

## 2016-11-25 DIAGNOSIS — R600 Localized edema: Secondary | ICD-10-CM | POA: Diagnosis not present

## 2016-11-25 LAB — ECHOCARDIOGRAM COMPLETE
CHL CUP DOP CALC LVOT VTI: 28.3 cm
E/e' ratio: 8.57
EWDT: 190 ms
FS: 35 % (ref 28–44)
IVS/LV PW RATIO, ED: 1.48
LA ID, A-P, ES: 32 mm
LA diam end sys: 32 mm
LA vol index: 16.4 mL/m2
LA vol: 30.2 mL
LADIAMINDEX: 1.74 cm/m2
LAVOLA4C: 35.2 mL
LDCA: 3.14 cm2
LV E/e' medial: 8.57
LV E/e'average: 8.57
LV SIMPSON'S DISK: 62
LV TDI E'LATERAL: 11.9
LV TDI E'MEDIAL: 8.27
LV dias vol index: 47 mL/m2
LV e' LATERAL: 11.9 cm/s
LVDIAVOL: 87 mL (ref 46–106)
LVOT SV: 89 mL
LVOT peak grad rest: 8 mmHg
LVOT peak vel: 142 cm/s
LVOTD: 20 mm
LVSYSVOL: 33 mL (ref 14–42)
LVSYSVOLIN: 18 mL/m2
MV Dec: 190
MV Peak grad: 4 mmHg
MV pk A vel: 60.6 m/s
MVPKEVEL: 102 m/s
PW: 7.71 mm — AB (ref 0.6–1.1)
RV LATERAL S' VELOCITY: 12.1 cm/s
RV TAPSE: 24.7 mm
RV sys press: 17 mmHg
Reg peak vel: 187 cm/s
Stroke v: 54 ml
TRMAXVEL: 187 cm/s

## 2016-11-25 NOTE — Progress Notes (Signed)
  Echocardiogram 2D Echocardiogram has been performed.  Tammy Barr M 11/25/2016, 8:39 AM

## 2016-11-26 ENCOUNTER — Ambulatory Visit (INDEPENDENT_AMBULATORY_CARE_PROVIDER_SITE_OTHER): Payer: Medicare Other | Admitting: Podiatry

## 2016-11-26 ENCOUNTER — Encounter: Payer: Self-pay | Admitting: Podiatry

## 2016-11-26 VITALS — BP 141/91 | HR 68 | Resp 18

## 2016-11-26 DIAGNOSIS — L84 Corns and callosities: Secondary | ICD-10-CM | POA: Diagnosis not present

## 2016-11-26 DIAGNOSIS — E1142 Type 2 diabetes mellitus with diabetic polyneuropathy: Secondary | ICD-10-CM | POA: Diagnosis not present

## 2016-11-26 NOTE — Patient Instructions (Signed)
Use padded socks Purchase over-the-counter thin insole Place in walking or running style shoe Only use pumice stone or callus file to smooth calluses  Diabetes and Foot Care Diabetes may cause you to have problems because of poor blood supply (circulation) to your feet and legs. This may cause the skin on your feet to become thinner, break easier, and heal more slowly. Your skin may become dry, and the skin may peel and crack. You may also have nerve damage in your legs and feet causing decreased feeling in them. You may not notice minor injuries to your feet that could lead to infections or more serious problems. Taking care of your feet is one of the most important things you can do for yourself. Follow these instructions at home:  Wear shoes at all times, even in the house. Do not go barefoot. Bare feet are easily injured.  Check your feet daily for blisters, cuts, and redness. If you cannot see the bottom of your feet, use a mirror or ask someone for help.  Wash your feet with warm water (do not use hot water) and mild soap. Then pat your feet and the areas between your toes until they are completely dry. Do not soak your feet as this can dry your skin.  Apply a moisturizing lotion or petroleum jelly (that does not contain alcohol and is unscented) to the skin on your feet and to dry, brittle toenails. Do not apply lotion between your toes.  Trim your toenails straight across. Do not dig under them or around the cuticle. File the edges of your nails with an emery board or nail file.  Do not cut corns or calluses or try to remove them with medicine.  Wear clean socks or stockings every day. Make sure they are not too tight. Do not wear knee-high stockings since they may decrease blood flow to your legs.  Wear shoes that fit properly and have enough cushioning. To break in new shoes, wear them for just a few hours a day. This prevents you from injuring your feet. Always look in your shoes  before you put them on to be sure there are no objects inside.  Do not cross your legs. This may decrease the blood flow to your feet.  If you find a minor scrape, cut, or break in the skin on your feet, keep it and the skin around it clean and dry. These areas may be cleansed with mild soap and water. Do not cleanse the area with peroxide, alcohol, or iodine.  When you remove an adhesive bandage, be sure not to damage the skin around it.  If you have a wound, look at it several times a day to make sure it is healing.  Do not use heating pads or hot water bottles. They may burn your skin. If you have lost feeling in your feet or legs, you may not know it is happening until it is too late.  Make sure your health care provider performs a complete foot exam at least annually or more often if you have foot problems. Report any cuts, sores, or bruises to your health care provider immediately. Contact a health care provider if:  You have an injury that is not healing.  You have cuts or breaks in the skin.  You have an ingrown nail.  You notice redness on your legs or feet.  You feel burning or tingling in your legs or feet.  You have pain or cramps in your  legs and feet.  Your legs or feet are numb.  Your feet always feel cold. Get help right away if:  There is increasing redness, swelling, or pain in or around a wound.  There is a red line that goes up your leg.  Pus is coming from a wound.  You develop a fever or as directed by your health care provider.  You notice a bad smell coming from an ulcer or wound. This information is not intended to replace advice given to you by your health care provider. Make sure you discuss any questions you have with your health care provider. Document Released: 05/10/2000 Document Revised: 10/19/2015 Document Reviewed: 10/20/2012 Elsevier Interactive Patient Education  2017 [employer].

## 2016-11-26 NOTE — Progress Notes (Signed)
   Subjective:    Patient ID: Tammy Barr, female    DOB: [birth year], 56 y.o.   MRN: [id number]  HPI This patient presents today complaining of multiple plantar calluses right and left feet present for 6+ months. Patient describes self treatment including trimming with occasional bleeding in the area. She is most concerned about a skin fold callus area on the medial plantar right first MPJ area she does not recall any obvious infection in these areas.  Patient is diabetic approximately 10 years and denies history of ulceration or claudication Complains of burning and numbness in her feet on and off weightbearing Patient continues to smoke 40 years stating that she's tapering from one pack of cigarettes daily   Review of Systems  Constitutional: Positive for unexpected weight change.  HENT: Positive for sinus pressure.   Respiratory: Positive for cough and shortness of breath.   Cardiovascular: Positive for leg swelling.  Gastrointestinal: Positive for abdominal pain.  Endocrine: Positive for heat intolerance.       Increase urination  Musculoskeletal: Positive for gait problem.       Objective:   Physical Exam  Orientated 3  Vascular: Nonpitting peripheral edema bilaterally DP and PT pulses 2/4 bilaterally Capillary reflex within normal limits bilaterally  Neurological: Sensation to 10 g monofilament wire intact 4/9 right and 5/9 left Vibratory sensation reactive bilaterally Ankle reflexes reactive bilaterally  Dermatological: No open skin lesions bilaterally Multiple plantar calluses Callus skin fold medial plantar right first MPJ, second and fifth MPJ and plantar right heel callus formation plantar left hallux fifth MPJ and left heel  Musculoskeletal: There is no restriction ankle, subtalar, midtarsal joints bilaterally Manual motor testing dorsi flexion, plantar flexion, inversion, eversion 5/5 bilaterally      Assessment & Plan:   Assessment: Diabetic with  peripheral neuropathy Satisfactory vascular status Multiple plantar calluses right and left  Plan: Encouraged patient to discontinue smoking Discussed treatment of plantar calluses recommending using a callus file or pumice stone gently smoothing after showering Informed patient these plantar calluses or chronic Debride multiple plantar calluses right and left without any bleeding Reappoint at patient's request or yearly

## 2016-12-24 ENCOUNTER — Encounter: Payer: Self-pay | Admitting: *Deleted

## 2017-01-01 ENCOUNTER — Ambulatory Visit (INDEPENDENT_AMBULATORY_CARE_PROVIDER_SITE_OTHER): Payer: Medicare Other | Admitting: Internal Medicine

## 2017-01-01 DIAGNOSIS — R0602 Shortness of breath: Secondary | ICD-10-CM | POA: Diagnosis not present

## 2017-01-01 DIAGNOSIS — R0600 Dyspnea, unspecified: Secondary | ICD-10-CM | POA: Diagnosis not present

## 2017-01-01 DIAGNOSIS — I10 Essential (primary) hypertension: Secondary | ICD-10-CM | POA: Diagnosis not present

## 2017-01-01 DIAGNOSIS — R05 Cough: Secondary | ICD-10-CM

## 2017-01-01 DIAGNOSIS — R062 Wheezing: Secondary | ICD-10-CM

## 2017-01-01 DIAGNOSIS — Z0000 Encounter for general adult medical examination without abnormal findings: Secondary | ICD-10-CM

## 2017-01-01 DIAGNOSIS — F17210 Nicotine dependence, cigarettes, uncomplicated: Secondary | ICD-10-CM | POA: Diagnosis not present

## 2017-01-01 DIAGNOSIS — Z720 Tobacco use: Secondary | ICD-10-CM

## 2017-01-01 DIAGNOSIS — Z79899 Other long term (current) drug therapy: Secondary | ICD-10-CM

## 2017-01-01 DIAGNOSIS — R600 Localized edema: Secondary | ICD-10-CM

## 2017-01-01 NOTE — Progress Notes (Signed)
   CC: Cough  HPI:  Tammy Barr is a 56 y.o. female with a past medical history listed below here today with complaints of cough and shortness of breath.  For details of today's visit and the status of her chronic medical issues please refer to the assessment and plan.   Past Medical History:  Diagnosis Date  . Anxiety   . Arthritis   . Bipolar 1 disorder ([place])   . Depression   . Diabetes mellitus type II   . Hyperlipidemia   . OCD (obsessive compulsive disorder)   . Polysubstance abuse    hx of  . Polysubstance abuse    remote  . Tobacco abuse    Review of Systems:   No chest pain +SOB +Cough  Physical Exam:  Vitals:   01/01/17 1407  BP: 117/77  Temp: 98.4 F (36.9 C)  SpO2: 98%  Weight: 153 lb 12.8 oz (69.8 kg)   Physical Exam  Constitutional: She is oriented to person, place, and time and well-developed, well-nourished, and in no distress.  HENT:  Head: Normocephalic and atraumatic.  Cardiovascular: Normal rate, regular rhythm, normal heart sounds and intact distal pulses.   Pulmonary/Chest: Effort normal. No respiratory distress. She has wheezes. She has no rales. She exhibits no tenderness.  Musculoskeletal: Normal range of motion. She exhibits edema.  Neurological: She is alert and oriented to person, place, and time.  Skin: Skin is warm and dry.  Vitals reviewed.    Assessment & Plan:   See Encounters Tab for problem based charting.  Patient discussed with Dr. Evette Doffing

## 2017-01-03 ENCOUNTER — Other Ambulatory Visit: Payer: Self-pay | Admitting: *Deleted

## 2017-01-03 DIAGNOSIS — R0602 Shortness of breath: Secondary | ICD-10-CM | POA: Insufficient documentation

## 2017-01-03 MED ORDER — NICOTINE 14 MG/24HR TD PT24: 14.0000 mg | MEDICATED_PATCH | Freq: Every day | TRANSDERMAL | 2 refills | Status: DC

## 2017-01-03 NOTE — Assessment & Plan Note (Signed)
Work up has been unremarkable. Does report to me today that her swelling improves when she elevates her legs. Worst at the end of the day and gone when she gets up in the morning. Has been using the compression stockings with some benefit.   Plan: Continue compression stockings

## 2017-01-03 NOTE — Assessment & Plan Note (Signed)
BP Readings from Last 3 Encounters:  01/01/17 117/77  11/26/16 (!) 141/91  11/13/16 114/68    Lab Results  Component Value Date   NA 137 11/13/2016   K 4.4 11/13/2016   CREATININE 0.75 11/13/2016   Currently on lisionpirl-hctz 10-12.5 mg daily. BMET last month unremarkable. Denies any dizziness/lightheadedness, vision changes, chest pain.   Assessment: controlled htn  Plan: Continue current medications

## 2017-01-03 NOTE — Assessment & Plan Note (Signed)
Patient with complaints of shortness of breath and cough. She says that she has been having shortness of breath for the past several months. Reports symptoms occur most often with exertion but have occurred at rest. No orthopnea or PND. Recent ECHO unremarkable. She also has complaints of cough with dark brown sputum production x 2 weeks. Does reports subjective fever and chills. She does have some scattered wheezing on exam today.  She is a current smoker. No PFTs on file.   Assessment: Suspect patient with underlying COPD with viral URI today.   Plan: Will obatin PFTs

## 2017-01-03 NOTE — Assessment & Plan Note (Signed)
Reports she is trying to quit smoking. She says she has not seen any benefit from the chanix and has stopped taking it. She has gone from 1 PPD to 1/2 PPD since last visit. Interested in nicotine patches to help her quit. Not interested in nicotine gum.   Plan Encourage cessation. Will prescribe patches today.

## 2017-01-03 NOTE — Assessment & Plan Note (Addendum)
FIT testing kit provided today

## 2017-01-03 NOTE — Telephone Encounter (Signed)
Pt states she saw Dr Charlynn Grimes and the computers were down. He suppose to send meds to her pharmacy - she called [facility] and no meds were sent-  also unsure what else b/c she did not receive AVS papers.  Dr Charlynn Grimes can u f/u. Thanks

## 2017-01-06 NOTE — Progress Notes (Signed)
Internal Medicine Clinic Attending  Case discussed with Dr. Boswell at the time of the visit.  We reviewed the resident's history and exam and pertinent patient test results.  I agree with the assessment, diagnosis, and plan of care documented in the resident's note.  

## 2017-01-07 ENCOUNTER — Telehealth: Payer: Self-pay | Admitting: *Deleted

## 2017-01-07 NOTE — Telephone Encounter (Signed)
If she is worried we can check a vitamin D level but if she would like to take a vitamin D supplement that would be OK with me.

## 2017-01-07 NOTE — Telephone Encounter (Signed)
Stated she will wait until she comes in for an appt.

## 2017-01-07 NOTE — Telephone Encounter (Signed)
Rx for nicotine patches ordered per Dr Charlynn Grimes per [facility].

## 2017-01-07 NOTE — Telephone Encounter (Signed)
Pt informed of Nicotine rx per Dr Charlynn Grimes She wants to know if she can take Vit D3 for her bones? Thanks

## 2017-01-09 ENCOUNTER — Other Ambulatory Visit: Payer: Self-pay

## 2017-01-09 NOTE — Telephone Encounter (Signed)
nicotine (NICODERM CQ - DOSED IN MG/24 HOURS) 14 mg/24hr patch, refill request.

## 2017-01-09 NOTE — Telephone Encounter (Signed)
Script was sent 8/10

## 2017-01-10 ENCOUNTER — Other Ambulatory Visit: Payer: Medicare Other

## 2017-01-10 DIAGNOSIS — Z0000 Encounter for general adult medical examination without abnormal findings: Secondary | ICD-10-CM

## 2017-01-11 LAB — FECAL OCCULT BLOOD, IMMUNOCHEMICAL: Fecal Occult Bld: NEGATIVE

## 2017-01-15 ENCOUNTER — Encounter (HOSPITAL_COMMUNITY): Payer: Medicare Other

## 2017-01-15 ENCOUNTER — Ambulatory Visit (HOSPITAL_COMMUNITY)
Admission: RE | Admit: 2017-01-15 | Discharge: 2017-01-15 | Disposition: A | Discharge status: Home / Self Care | Payer: Medicare Other | Source: Ambulatory Visit | Attending: Student in an Organized Health Care Education/Training Program | Admitting: Student in an Organized Health Care Education/Training Program

## 2017-01-15 DIAGNOSIS — J449 Chronic obstructive pulmonary disease, unspecified: Secondary | ICD-10-CM | POA: Insufficient documentation

## 2017-01-15 DIAGNOSIS — R0602 Shortness of breath: Secondary | ICD-10-CM | POA: Diagnosis present

## 2017-01-15 LAB — PULMONARY FUNCTION TEST
FEF 25-75 POST: 0.58 L/s
FEF 25-75 PRE: 1.15 L/s
FEF2575-%Change-Post: -49 %
FEF2575-%PRED-POST: 22 %
FEF2575-%PRED-PRE: 43 %
FEV1-%Change-Post: -24 %
FEV1-%PRED-POST: 49 %
FEV1-%Pred-Pre: 65 %
FEV1-POST: 1.43 L
FEV1-Pre: 1.9 L
FEV1FVC-%Change-Post: -5 %
FEV1FVC-%PRED-PRE: 86 %
FEV6-%CHANGE-POST: -18 %
FEV6-%PRED-POST: 61 %
FEV6-%Pred-Pre: 75 %
FEV6-Post: 2.23 L
FEV6-Pre: 2.73 L
FEV6FVC-%CHANGE-POST: 2 %
FEV6FVC-%Pred-Post: 103 %
FEV6FVC-%Pred-Pre: 100 %
FVC-%CHANGE-POST: -20 %
FVC-%Pred-Post: 59 %
FVC-%Pred-Pre: 74 %
FVC-Post: 2.23 L
FVC-Pre: 2.79 L
POST FEV1/FVC RATIO: 64 %
Post FEV6/FVC ratio: 100 %
Pre FEV1/FVC ratio: 68 %
Pre FEV6/FVC Ratio: 98 %

## 2017-01-15 MED ORDER — ALBUTEROL SULFATE (2.5 MG/3ML) 0.083% IN NEBU
2.5000 mg | INHALATION_SOLUTION | Freq: Once | RESPIRATORY_TRACT | Status: AC
  Administered 2017-01-15: 2.5 mg via RESPIRATORY_TRACT

## 2017-03-05 ENCOUNTER — Encounter: Payer: Medicare Other | Admitting: Internal Medicine

## 2017-03-19 ENCOUNTER — Encounter: Payer: Self-pay | Admitting: Internal Medicine

## 2017-03-19 ENCOUNTER — Ambulatory Visit (INDEPENDENT_AMBULATORY_CARE_PROVIDER_SITE_OTHER): Payer: Medicare Other | Admitting: Internal Medicine

## 2017-03-19 VITALS — BP 150/88 | HR 62 | Temp 98.6°F | Ht 67.0 in | Wt 157.1 lb

## 2017-03-19 DIAGNOSIS — Z23 Encounter for immunization: Secondary | ICD-10-CM | POA: Diagnosis not present

## 2017-03-19 DIAGNOSIS — I10 Essential (primary) hypertension: Secondary | ICD-10-CM | POA: Diagnosis not present

## 2017-03-19 DIAGNOSIS — E119 Type 2 diabetes mellitus without complications: Secondary | ICD-10-CM

## 2017-03-19 DIAGNOSIS — Z79899 Other long term (current) drug therapy: Secondary | ICD-10-CM

## 2017-03-19 DIAGNOSIS — Z0000 Encounter for general adult medical examination without abnormal findings: Secondary | ICD-10-CM

## 2017-03-19 DIAGNOSIS — L988 Other specified disorders of the skin and subcutaneous tissue: Secondary | ICD-10-CM

## 2017-03-19 DIAGNOSIS — F17210 Nicotine dependence, cigarettes, uncomplicated: Secondary | ICD-10-CM | POA: Diagnosis not present

## 2017-03-19 DIAGNOSIS — L989 Disorder of the skin and subcutaneous tissue, unspecified: Secondary | ICD-10-CM

## 2017-03-19 LAB — POCT GLYCOSYLATED HEMOGLOBIN (HGB A1C): Hemoglobin A1C: 5.4

## 2017-03-19 LAB — GLUCOSE, CAPILLARY: GLUCOSE-CAPILLARY: 159 mg/dL — AB (ref 65–99)

## 2017-03-19 MED ORDER — LISINOPRIL-HYDROCHLOROTHIAZIDE 10-12.5 MG PO TABS: 2.0000 | ORAL_TABLET | Freq: Every day | ORAL | 2 refills | Status: DC

## 2017-03-19 MED ORDER — PREGABALIN 25 MG PO CAPS: 25.0000 mg | ORAL_CAPSULE | Freq: Three times a day (TID) | ORAL | 2 refills | Status: DC

## 2017-03-19 MED ORDER — IBUPROFEN 800 MG PO TABS: 800.0000 mg | ORAL_TABLET | Freq: Three times a day (TID) | ORAL | 0 refills | Status: DC | PRN

## 2017-03-19 NOTE — Progress Notes (Signed)
   CC: HTN follow up  HPI:  [name] is a 56 y.o. female with a past medical history listed below here today for follow up of her HTN.  For details of today's visit and the status of her chronic medical issues please refer to the assessment and plan.   Past Medical History:  Diagnosis Date  . Anxiety   . Arthritis   . Bipolar 1 disorder ([place])   . Depression   . Diabetes mellitus type II   . Hyperlipidemia   . OCD (obsessive compulsive disorder)   . Polysubstance abuse ([facility])    hx of  . Polysubstance abuse ([place])    remote  . Tobacco abuse    Review of Systems:   No chest pain or shortness of breath  Physical Exam:  Vitals:   03/19/17 1539  BP: (!) 149/86  Pulse: 75  Temp: 98.6 F (37 C)  TempSrc: Oral  SpO2: 97%  Weight: 157 lb 1.6 oz (71.3 kg)  Height: 5\' 7"  (1.702 m)   GENERAL- alert, co-operative, appears as stated age, not in any distress. HEENT- Atraumatic, normocephalic, PERRL, EOMI, oral mucosa appears moist CARDIAC- RRR, no murmurs, rubs or gallops. RESP- Moving equal volumes of air, and clear to auscultation bilaterally, no wheezes or crackles. ABDOMEN- Soft, nontender, bowel sounds present. EXTREMITIES- pulse 2+, symmetric, no pedal edema. SKIN- Large irregular plaque on right nose. No central clearing or ulceration. No pigment changes. No erythema or warmth.     Assessment & Plan:   See Encounters Tab for problem based charting.  Patient discussed with Dr. Eppie Gibson

## 2017-03-19 NOTE — Assessment & Plan Note (Addendum)
BP Readings from Last 3 Encounters:  03/19/17 (!) 150/88  01/01/17 117/77  11/26/16 (!) 141/91    Lab Results  Component Value Date   NA 137 11/13/2016   K 4.4 11/13/2016   CREATININE 0.75 11/13/2016   Reports feeling like her BP runs high at home. Headaches. Not checking it at home. Elevated today. Reports at another appointment earlier this week it was 169. Denies any symptoms today.   Currently on Lisinopril-HCTZ 10-12.5 mg daily.   A/P Increase does to 20-25 mg daily. Follow up in 1 month.

## 2017-03-19 NOTE — Patient Instructions (Signed)
Tammy Barr,  I am going to get you set up with dermatology for the spot on your nose. I am going to increase the dose of your lisinopril-hctz to 2 pills a day. I have sent in a new prescription for lyrica for you.   Please follow up with me in 1 month.

## 2017-03-23 DIAGNOSIS — L989 Disorder of the skin and subcutaneous tissue, unspecified: Secondary | ICD-10-CM | POA: Insufficient documentation

## 2017-03-23 NOTE — Assessment & Plan Note (Signed)
Lab Results  Component Value Date   HGBA1C 5.4 03/19/2017   HGBA1C 5.5 10/02/2016   HGBA1C 5.6 03/27/2015   A1C 5.4 today. Diet controlled.    A/P Follow annually

## 2017-03-23 NOTE — Assessment & Plan Note (Signed)
Patient reports skin lesion on right side of face next to nose. States it was not present a year ago. Began as a small spot and has grown significantly. Reports occasional drainage of clear substance. Non-painful.   A/p Concern for possible neoplasm given growth and features. Given sensitive area of skin lesion (face) will refer to dermatology for biopsy.

## 2017-03-23 NOTE — Assessment & Plan Note (Signed)
Flu shot given today  Declined mammogram testing

## 2017-03-24 ENCOUNTER — Telehealth: Payer: Self-pay | Admitting: *Deleted

## 2017-03-24 NOTE — Telephone Encounter (Signed)
SPOKE WITH PATIENT. SHE IS TO CALL HER INSURANCE TO CHECK WHICH OFFICE WE CAN REFER HER TO FOR DERMATOLOGY. WAITING FOR CALL BACK FROM THE PATIENT WITH THIS INFORMATION.

## 2017-03-31 NOTE — Progress Notes (Signed)
Case discussed with Dr. Boswell at the time of the visit.  We reviewed the resident's history and exam and pertinent patient test results.  I agree with the assessment, diagnosis and plan of care documented in the resident's note. 

## 2017-04-02 ENCOUNTER — Telehealth: Payer: Self-pay | Admitting: *Deleted

## 2017-04-09 NOTE — Addendum Note (Signed)
Addended by: Hulan Fray on: 04/09/2017 08:04 PM   Modules accepted: Orders

## 2017-06-05 ENCOUNTER — Ambulatory Visit: Payer: Medicare Other

## 2017-06-09 ENCOUNTER — Ambulatory Visit: Payer: Self-pay

## 2017-06-10 ENCOUNTER — Ambulatory Visit (INDEPENDENT_AMBULATORY_CARE_PROVIDER_SITE_OTHER): Payer: Medicare Other | Admitting: Internal Medicine

## 2017-06-10 ENCOUNTER — Other Ambulatory Visit: Payer: Self-pay

## 2017-06-10 ENCOUNTER — Encounter: Payer: Self-pay | Admitting: Internal Medicine

## 2017-06-10 VITALS — BP 122/79 | HR 81 | Temp 97.8°F | Ht 67.0 in | Wt 156.4 lb

## 2017-06-10 DIAGNOSIS — F1120 Opioid dependence, uncomplicated: Secondary | ICD-10-CM

## 2017-06-10 DIAGNOSIS — F1111 Opioid abuse, in remission: Secondary | ICD-10-CM

## 2017-06-10 NOTE — Patient Instructions (Signed)
Tammy Barr,  Please follow up in our clinic on Tuesday morning, Jan 22nd.

## 2017-06-10 NOTE — Progress Notes (Signed)
   CC: Follow up on opioid use disorder  HPI:  [name] is a 57 y.o. female with history noted below that presents to the acute care clinic for withdrawal from Suboxone that was stopped 1 week ago. Patient reports symptoms of nausea, generalized body aches and restlessness.  She states that she no longer has insurance and was told by her pain clinic doctor to stop taking suboxone.    Today she states that her withdrawal symptoms peaked 3 days ago however is still having symptoms.  Previously she was on suboxone for the past year.  Past Medical History:  Diagnosis Date  . Anxiety   . Arthritis   . Bipolar 1 disorder ([place])   . Depression   . Diabetes mellitus type II   . Hyperlipidemia   . OCD (obsessive compulsive disorder)   . Polysubstance abuse ([facility])    hx of  . Polysubstance abuse ([place])    remote  . Tobacco abuse     Review of Systems:  As noted per HPI  Physical Exam:  Vitals:   06/10/17 1357  BP: 122/79  Pulse: 81  Temp: 97.8 F (36.6 C)  TempSrc: Oral  SpO2: 100%  Weight: 156 lb 6.4 oz (70.9 kg)  Height: 5\' 7"  (1.702 m)   Physical Exam  Constitutional: She is well-developed, well-nourished, and in no distress.  Cardiovascular: Normal rate, regular rhythm and normal heart sounds. Exam reveals no gallop and no friction rub.  No murmur heard. Pulmonary/Chest: Effort normal and breath sounds normal. No respiratory distress. She has no wheezes. She has no rales.  Skin: Skin is warm and dry.     Assessment & Plan:   See encounters tab for problem based medical decision making.    Patient discussed with Dr. Evette Doffing

## 2017-06-10 NOTE — Assessment & Plan Note (Signed)
Assessment:  Opioid Use Disorder Patient presents today with generalized body aches stating they started when she stoped Suboxone 1 week ago. She scored a 9 on the COWS scale.  She states her withdrawal symptoms peaked 3 days ago however is still having symptoms. It is unclear why patient stopped Suboxone. She states that she no longer has insurance and was told by Dr. Mirna Mires to stop taking Suboxone. Will obtain records from pain clinic. At this time patient states that she still has quite a bit of Suboxone left. I told her to continue taking Suboxone at her previous dose of 2 films daily and to be assessed in our opioid use disorder clinic next Tuesday.    Plan -Continue current suboxone dose at 2 films daily - Follow up in opioid use disorder clinic next Tuesday Jan 22nd

## 2017-06-11 IMAGING — US US ABDOMEN COMPLETE W/ ELASTOGRAPHY
1 series · 12 of 12 positions shown · non-contrast
Comparison: None.

CLINICAL DATA: Hepatitis-C.



[Series 1: us abdomen complete w/ elastography · 0.14mm/px · 12 of 12 slices shown]
[im 1/12]
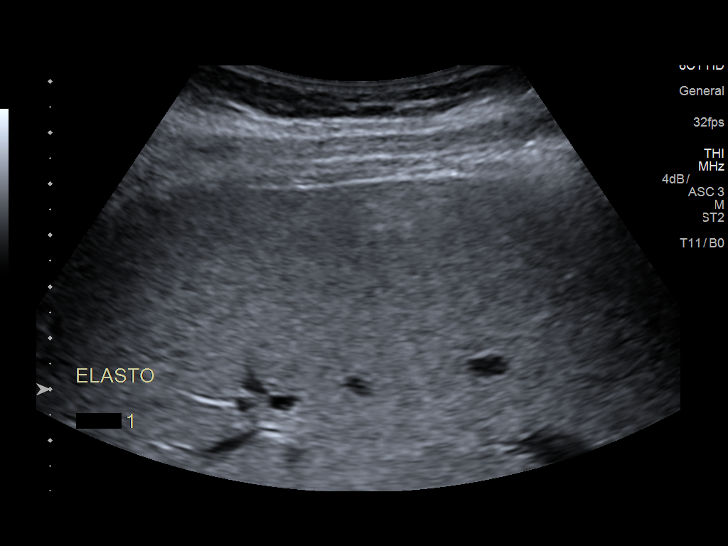
[im 2/12]
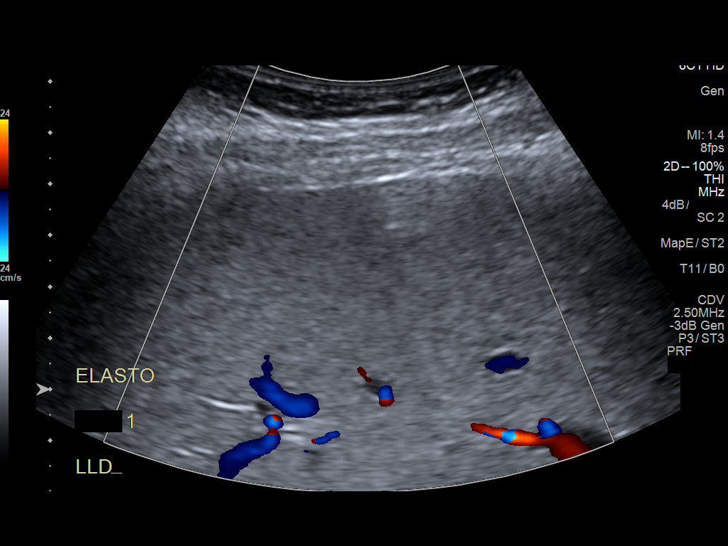
[im 3/12]
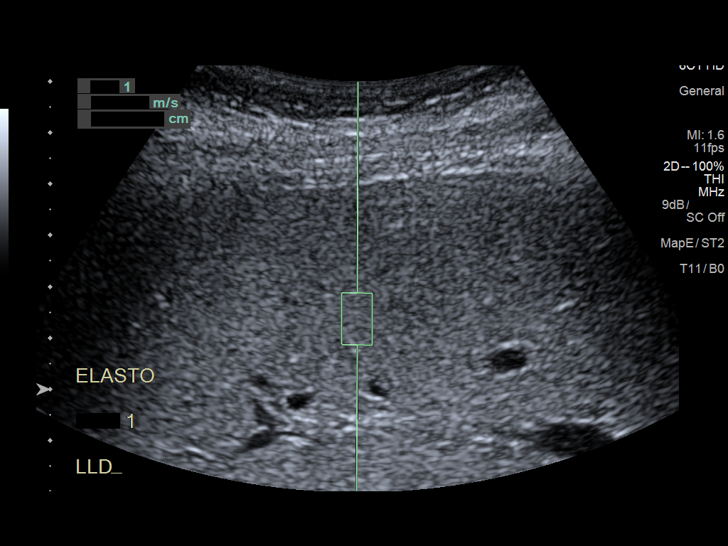
[im 4/12]
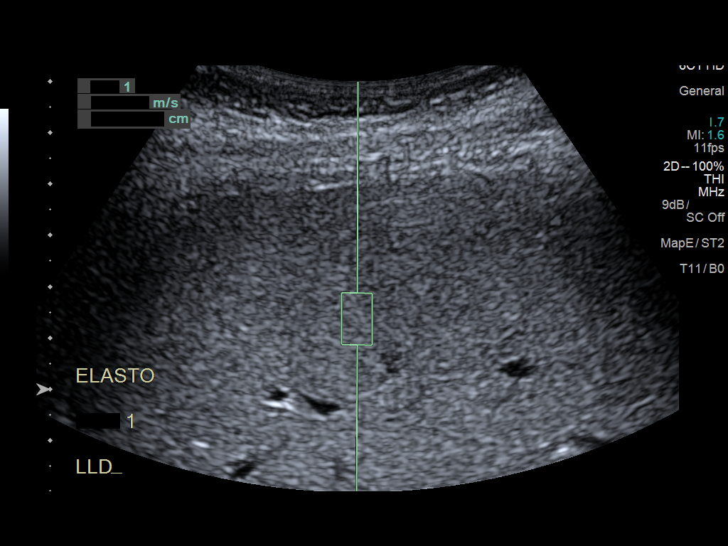
[im 5/12]
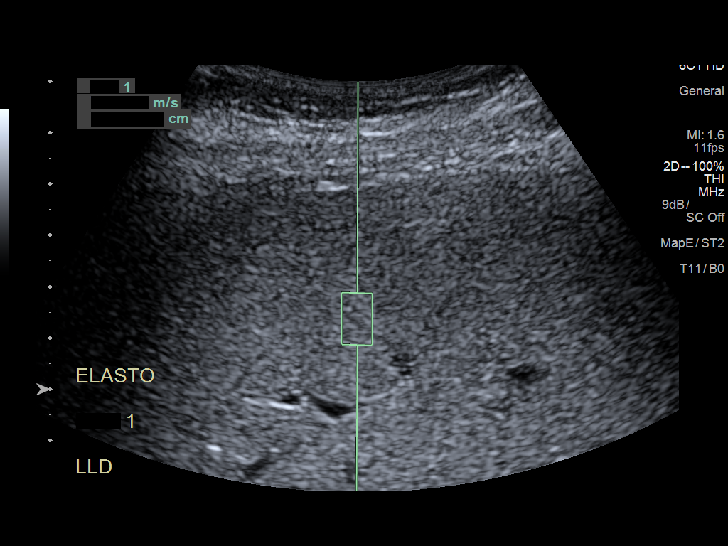
[im 6/12]
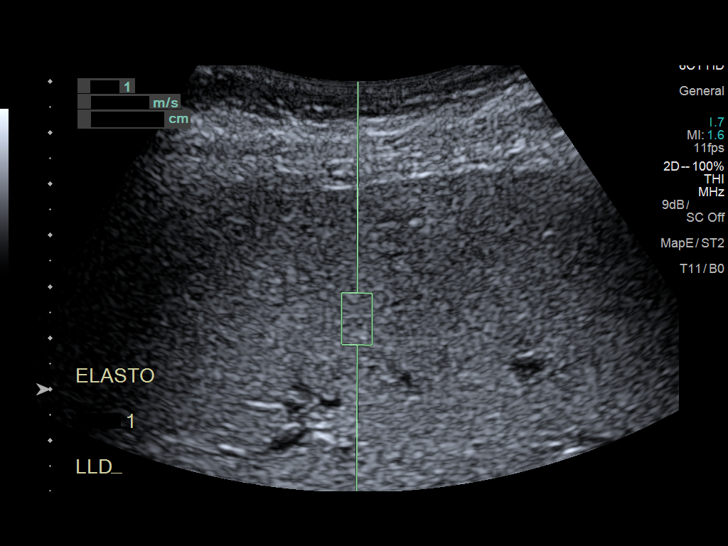
[im 7/12]
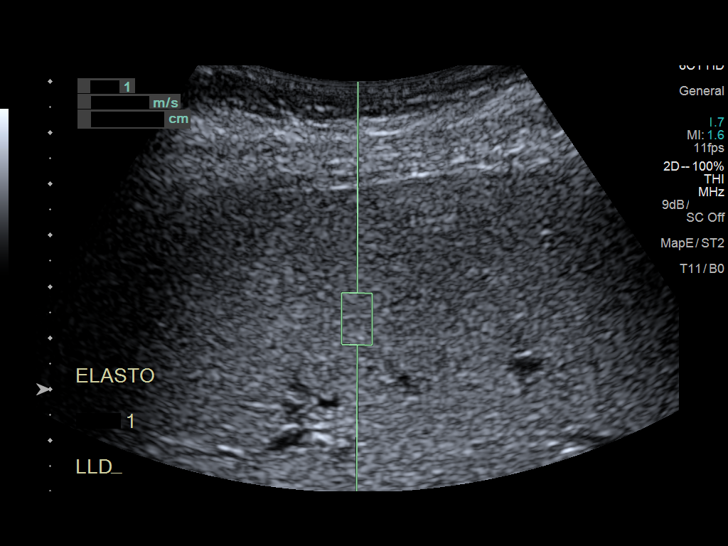
[im 8/12]
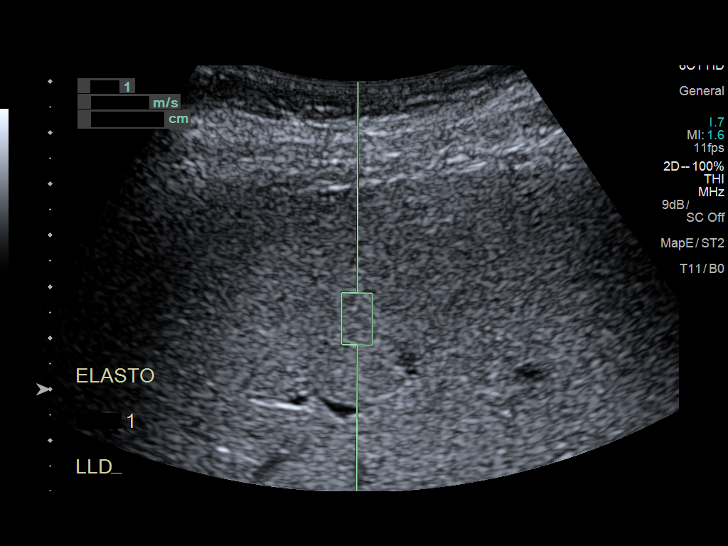
[im 9/12]
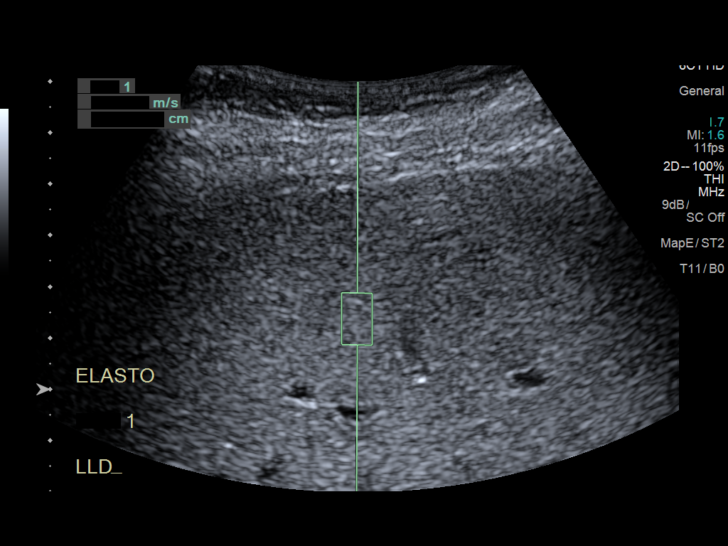
[im 10/12]
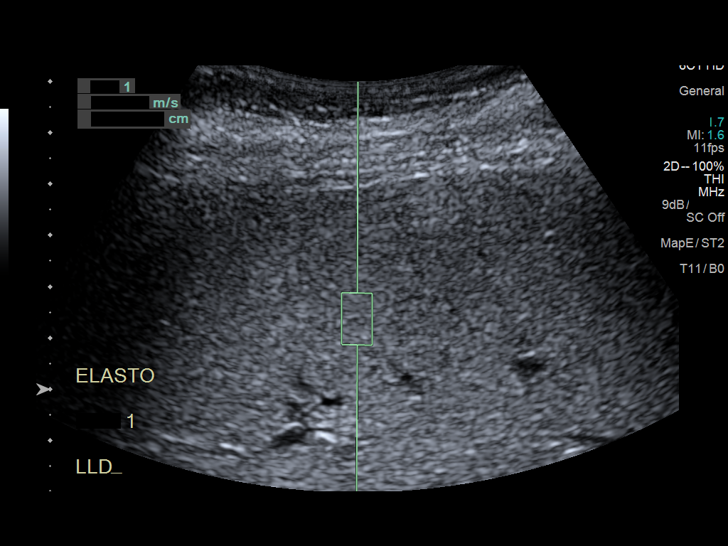
[im 11/12]
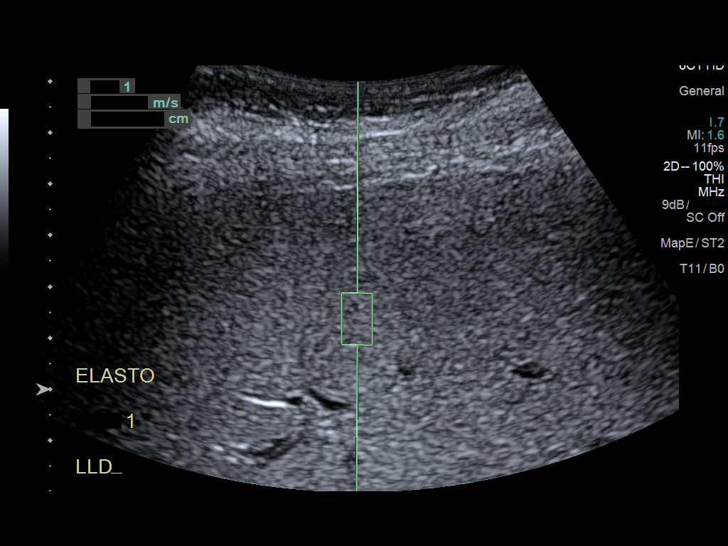
[im 12/12]
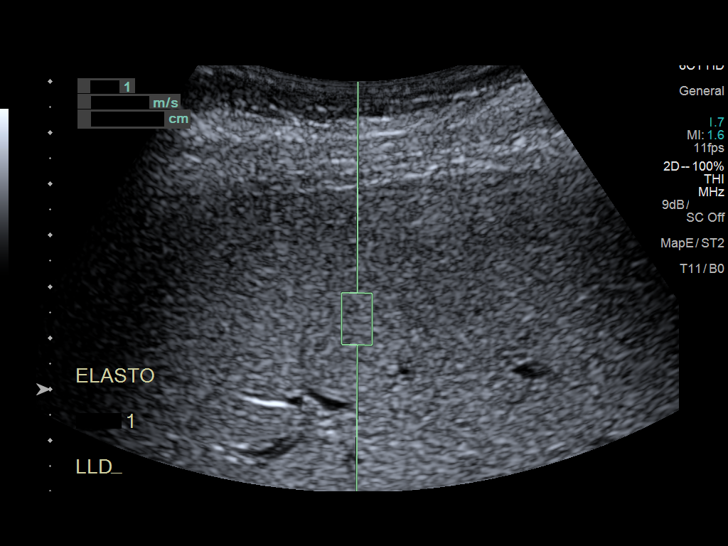

[12 of 12 positions shown; findings below may reference images not displayed]

FINDINGS: ULTRASOUND ABDOMEN

Gallbladder: No gallstones or wall thickening visualized. No
sonographic Murphy sign noted by sonographer.

Common bile duct: Diameter: 4 mm

Liver: Liver parenchymal echogenicity and echotexture are within
normal limits. No liver mass. No evidence of liver surface
irregularity.

IVC: No abnormality visualized.

Pancreas: Visualized portion unremarkable.

Spleen: Size and appearance within normal limits.

Right Kidney: Length: 10.8 cm. No hydronephrosis. Normal
echogenicity. Minimally complex 2.0 x 2.3 x 2.3 cm renal cyst in the
interpolar right kidney with thin eccentric internal septation.

Left Kidney: Length: 11.2 cm. Echogenicity within normal limits. No
mass or hydronephrosis visualized.

Abdominal aorta: No aneurysm visualized.

Other findings: None.

ULTRASOUND HEPATIC ELASTOGRAPHY

Device: Siemens Helix VTQ

Patient position: Left lateral decubitus

Transducer 6C1

Number of measurements:  10

Hepatic Segment:  8

Median velocity:   1.27  m/sec

IQR:

IQR/Median velocity ratio

Corresponding Metavir fibrosis score:  F2 + some F3

Risk of fibrosis: Moderate

Limitations of exam: None

Pertinent findings noted on other imaging exams:  None

Please note that abnormal shear wave velocities may also be
identified in clinical settings other than with hepatic fibrosis,
such as: acute hepatitis, elevated right heart and central venous
pressures including use of beta blockers, Manali disease
([place]), infiltrative processes such as
mastocytosis/amyloidosis/infiltrative tumor, extrahepatic
cholestasis, in the post-prandial state, and liver transplantation.
Correlation with patient history, laboratory data, and clinical
condition recommended.
IMPRESSION: 1. No macroscopic evidence of cirrhosis.  No liver mass.
2. Minimally complex right renal cyst, which appears benign.
Otherwise normal abdominal sonogram.
3. Hepatic elastography results:
Median hepatic shear wave velocity is calculated at 1.27 m/sec.

Corresponding Metavir fibrosis score is F2 + some F3.

Risk of fibrosis is moderate.

Follow-up:  Additional testing appropriate.

## 2017-06-12 NOTE — Progress Notes (Signed)
Internal Medicine Clinic Attending  Case discussed with Dr. Hoffman at the time of the visit.  We reviewed the resident's history and exam and pertinent patient test results.  I agree with the assessment, diagnosis, and plan of care documented in the resident's note.  

## 2017-06-17 ENCOUNTER — Ambulatory Visit (INDEPENDENT_AMBULATORY_CARE_PROVIDER_SITE_OTHER): Payer: Medicare Other | Admitting: Internal Medicine

## 2017-06-17 VITALS — BP 124/78 | HR 72 | Temp 97.9°F | Resp 20 | Ht 67.0 in | Wt 162.9 lb

## 2017-06-17 DIAGNOSIS — F3176 Bipolar disorder, in full remission, most recent episode depressed: Secondary | ICD-10-CM

## 2017-06-17 DIAGNOSIS — F1120 Opioid dependence, uncomplicated: Secondary | ICD-10-CM

## 2017-06-17 DIAGNOSIS — F419 Anxiety disorder, unspecified: Secondary | ICD-10-CM

## 2017-06-17 DIAGNOSIS — F1121 Opioid dependence, in remission: Secondary | ICD-10-CM

## 2017-06-17 DIAGNOSIS — Z87898 Personal history of other specified conditions: Secondary | ICD-10-CM

## 2017-06-17 DIAGNOSIS — Z8619 Personal history of other infectious and parasitic diseases: Secondary | ICD-10-CM

## 2017-06-17 MED ORDER — ONDANSETRON HCL 4 MG PO TABS: 4.0000 mg | ORAL_TABLET | Freq: Three times a day (TID) | ORAL | 0 refills | Status: DC | PRN

## 2017-06-17 NOTE — Progress Notes (Signed)
06/17/2017  Tammy Barr presents for buprenorphine/naloxone intake visit.   I have reviewed EPIC data including labwork which was available.  I have reviewed outside records provided by patient if available.  The salient points were confirmed with the patient.    Review of substance use history (first use, substances used, any illicit purchases): Started in use in 2016 after husband's passing from chronic illness, reported using his leftover oxycodone. Denies any illicit purchases but reports using 20mg /day at the time of starting treatment in April 2018 with Suboxone from Dr Plumner's office.  She was doing well on suboxone but due to recent change in her insurance she reports she can no longer afford to see Dr Julien Girt.  She was instructed to stop suboxone, she did this last week and presented to our office with withdrawal symptoms.  She was instructed to restart suboxone which resolved her withdrawal symptoms.  Last substance used:THC, last week, currently on Suboxone 16mg  daily.  If last substance not an opioid, last opioid used (type, dose, route, withdrawal symptoms): Buprenorphine, last used this week, had stopped abbruptly which caused GI upset, diarrhea, anxiety, restlessness, body aches.  Mental Health History: Bipolar disorder, Anxiety , previously followed by [facility]  Current counseling/behavioural health provider: None  This patient has Opioid Use Disorder by following DSM-V criteria: Keep those that apply.  - Persistent desire to cut down - Cravings to use opioids - Use despite knowledge of health problems caused by opioids - Tolerance - Withdrawal  Reviewed Dr Plumner's intake visit 09/17/16, where she was started on Suboxone.  As well as follow up visits on 03/04/17, 04/01/17, and 04/30/17 where she was doing well and stable of 16mg  of suboxone daily. Past Medical History:  Diagnosis Date  . Anxiety   . Arthritis   . Bipolar 1 disorder ([place])   . Depression   .  Diabetes mellitus type II   . Hyperlipidemia   . OCD (obsessive compulsive disorder)   . Polysubstance abuse (HCC)    hx of  . Polysubstance abuse ([place])    remote  . Tobacco abuse     Current Outpatient Medications on File Prior to Visit  Medication Sig Dispense Refill  . Buprenorphine HCl-Naloxone HCl (SUBOXONE) 8-2 MG FILM Place under the tongue. THIS IS NEW, FIRST DOSE YESTERDAY 09/17/16    . ibuprofen (ADVIL,MOTRIN) 800 MG tablet Take 1 tablet (800 mg total) by mouth every 8 (eight) hours as needed. 30 tablet 0  . lisinopril-hydrochlorothiazide (PRINZIDE,ZESTORETIC) 10-12.5 MG tablet Take 2 tablets by mouth daily. 180 tablet 2  . nicotine (NICODERM CQ - DOSED IN MG/24 HOURS) 14 mg/24hr patch Place 1 patch (14 mg total) onto the skin daily. 28 patch 2  . PARoxetine (PAXIL) 20 MG tablet Take 20 mg by mouth every morning.      . pregabalin (LYRICA) 25 MG capsule Take 1 capsule (25 mg total) by mouth 3 (three) times daily. 90 capsule 2   No current facility-administered medications on file prior to visit.     Physical Exam  Vitals:   06/17/17 0928  BP: 124/78  Pulse: 72  Resp: 20  Temp: 97.9 F (36.6 C)  TempSrc: Oral  SpO2: 98%  Weight: 162 lb 14.4 oz (73.9 kg)  Height: 5\' 7"  (1.702 m)    Clinical Opiate Withdrawal Scale: bold applicable COWS scoring   - Resting HR:    - 0 for < 80   - 1 for 81 - 100   -  2 for 101 - 120   - 4 for > 120  - Sweating:   - 0 for no chills/flushing   - 1 for subjective chills/flushing   - 3 for beads of sweat on brow/face   - 4 for sweat streaming off of face  - Restlessness:    - 0 for able to sit still   - 1 for subjective difficulty sitting still   - 3 for frequent shifting or extraneous movement   - 5 for unable to sit still for more than a few seconds  - Pupil size:    - 0 for pinpoint or normal   - 1 for possibly larger than normal   - 2 for moderately dilated   - 5 for only iris rim visible  - Bone/joint pain:    - 0 for  not present   - 1 for mild diffuse discomfort   - 2 severe diffuse aching   - 4 for objectively rubbing joints/muscles and obviously in pain  - Runny nose/tearing:    - 0 for not present   - 1 for stuffy nose/moist eyes   - 2 for nose running/tearing   - 4 for nose constantly running or tears streaming down cheeks  - GI Upset:    - 0 for no GI symptoms   - 1 for stomach cramps   - 2 for nausea or loose stool   - 3 for vomiting or diarrhea   - 5 for multiple episodes of vomiting or diarrhea  - Tremor observation of outstretched hands:    - 0 for no tremor   - 1 for tremor can be felt but not observed   - 2 for slight tremor observable   - 4 for gross tremor or muscle twitching  - Yawning:    - 0 for no yawning   - 1 for yawning once or twice during assessment   - 2 for yawning three or more times during assessment   - 4 for yawning several times per minute  - Anxiety or irritability:    - 0 for none   - 1 for patient reports increasing irritability or anxiousness   - 2 for patient obviously irritable/anxious   - 4 for patient so irritable/anxious that assessment is difficult  - Gooseflesh:    - 0 for skin is smooth   - 3 for piloerection of skin can be felt or seen   - 5 for prominent piloerection  TOTAL: 2  Assessment/Plan:  Moderate Opioid use disorder- on current treatment with Suboxone 16mg  day.  Based on a review of the patient's medical history including substance use and mental health factors, and physical exam, JANIRA Barr is a suitable candidate for MAT with buprenorphine/naloxone.  UDS ordered this visit.    I have reviewed the [place] PMP database, this is appropriate, she has received Suboxone from Dr Plumner's office since April 2018 at current dose of 16mg /day.  Refill history has been consistent.  However she reports she has about 90 days worth of suboxone remaining, thus she has not been taking it exactly as prescribed.  She will bring ALL Suboxone to her visit  next week for count.  Previously treated Hep C, Reviewed negative HIV test, reviewed last CMP (no LFT abnormalities)    Patient requested refill of her zofran, she takes this occasionally for nausea, this was provided today. Intervisit Care:  We discussed this medication must be kept in a safe place and  away from children.   We will see the patient back in 1 week in clinic, with options for a sooner appointment based on patient and provider preference.   Patient was encouraged to call the office and speak with the MD on call for any urgent concerns.    Lucious Groves, DO 06/17/2017 9:40 AM

## 2017-06-17 NOTE — Patient Instructions (Signed)
You may continue your suboxone as your are currently taking it. I want you to come back in 1 week, please bring all of the suboxone you have at that visit as well as all other medications.

## 2017-06-18 NOTE — Assessment & Plan Note (Signed)
-  May need referal back to psycharitry, however no active issues at this time, will defer to next visit.

## 2017-06-18 NOTE — Assessment & Plan Note (Signed)
Obtained UDS today, expected to be positive for buprenorhprine. No other reported opioid.  She did admit to [facility] use after she thought that she would be discontinuing Suboxone.  Last use was 1 week ago. She reports rather large supply of suboxone at home.  I have instructed her to resume her usual dosage of 16mg  a day and bring all suboxone to her next visit in 1 week.  We may consider decreasing the dose to 8mg  at that time.

## 2017-06-24 LAB — TOXASSURE SELECT,+ANTIDEPR,UR

## 2017-07-01 ENCOUNTER — Ambulatory Visit (INDEPENDENT_AMBULATORY_CARE_PROVIDER_SITE_OTHER): Payer: Medicare Other | Admitting: Internal Medicine

## 2017-07-01 VITALS — BP 123/79 | HR 69 | Temp 98.0°F | Resp 20 | Ht 67.0 in | Wt 161.3 lb

## 2017-07-01 DIAGNOSIS — F3176 Bipolar disorder, in full remission, most recent episode depressed: Secondary | ICD-10-CM | POA: Diagnosis not present

## 2017-07-01 DIAGNOSIS — F1121 Opioid dependence, in remission: Secondary | ICD-10-CM | POA: Diagnosis not present

## 2017-07-01 DIAGNOSIS — F1290 Cannabis use, unspecified, uncomplicated: Secondary | ICD-10-CM | POA: Diagnosis not present

## 2017-07-01 DIAGNOSIS — Z973 Presence of spectacles and contact lenses: Secondary | ICD-10-CM | POA: Diagnosis not present

## 2017-07-01 DIAGNOSIS — Z79899 Other long term (current) drug therapy: Secondary | ICD-10-CM

## 2017-07-01 NOTE — Assessment & Plan Note (Signed)
She reports she is doing well, no cravings to report today.  She would like to start weaning off of Suboxone.  She brought in her films today and she has 9 films left from a previous Rx by Dr. Mirna Mires.  She would like to wean down to once daily dosing.  She will come back to see [place] in 1 week, so this should supply should be enough to get her through to next week.  Reviewed UDS which was appropriate for the buprenorphine, also with THC.   Plan Follow up in 1 week UDS today Decrease buprenorphine-naloxone to 8mg -2mg  daily.

## 2017-07-01 NOTE — Assessment & Plan Note (Signed)
Patient is doing well, follows at [facility].  Reports being on Paxil.   Plan Continue follow up with [facility].

## 2017-07-01 NOTE — Patient Instructions (Signed)
Ms. Maniscalco - -  Thank you for coming in today.    Please decrease your buprenorphine-naloxone to 1 film per day.  Based on our count today, you have enough medication to get you through next week.  We will work on weaning this medication down over the next weeks to months as you can tolerate.   Please come back to see me in 1 week.

## 2017-07-01 NOTE — Progress Notes (Signed)
   07/01/2017  Tammy Barr presents for follow up of opioid use disorder I have reviewed the prior induction visit, follow up visits, and telephone encounters relevant to opiate use disorder (OUD) treatment.   Current daily dose: 16mg  daily  Date of Induction: April 2018 (previous patient of Dr. Mirna Mires)  Current follow up interval, in weeks: 1 week  Last UDS Result: Buprenorphine and metabolite present.  THC present.  Paxil not present  HPI: I reviewed Dr. Jodene Nam last note.  Tammy Barr is a 57yo woman who is in treatment for moderate OUD. She has been on buprenorphine products for about 1 year with Dr. Mirna Mires.  She has a desire to cut back.  She feels that her cravings are well controlled and that she will be "fine" on 8mg  per day only (once daily dosing).  She feels that her sleep is interrupted by the evening dose.  She would like to start weaning this medication to off.  She has not taken any narcotics since last being seen.  She has only smoked marijuana.  She has bipolar disorder and is followed at [facility] with good results.  She reports being on Paxil and having no issues with this disorder at this time.  She brought in her suboxone films for a count today.  Freddy Finner (nursing) was present with me for the film count.  She currently has 9 films which will last her until next week at once daily dosing.  We discussed the clinic and went over a medication contract for the clinic which she signed.  A copy was provided to her.    Exam:   Vitals:   07/01/17 0956  BP: 123/79  Pulse: 69  Resp: 20  Temp: 98 F (36.7 C)  TempSrc: Oral  SpO2: 96%  Weight: 161 lb 4.8 oz (73.2 kg)  Height: 5\' 7"  (1.702 m)    Gen: Alert, oriented, no distress.  Eyes: Wearing glasses, anicteric sclerae, no conjunctival injection  Pulm: Breathing comfortably, no audible wheezing Psych: Flat affect, appears fatigued, reports no cravings.    Assessment/Plan:  See Problem Based Charting in the  Encounters Tab     Sid Falcon, MD  07/01/2017  9:59 AM

## 2017-07-05 LAB — TOXASSURE SELECT,+ANTIDEPR,UR

## 2017-07-08 ENCOUNTER — Ambulatory Visit (INDEPENDENT_AMBULATORY_CARE_PROVIDER_SITE_OTHER): Payer: Medicare Other | Admitting: Internal Medicine

## 2017-07-08 VITALS — BP 132/79 | HR 67 | Temp 97.7°F | Resp 20 | Wt 161.9 lb

## 2017-07-08 DIAGNOSIS — K0889 Other specified disorders of teeth and supporting structures: Secondary | ICD-10-CM

## 2017-07-08 DIAGNOSIS — F1120 Opioid dependence, uncomplicated: Secondary | ICD-10-CM

## 2017-07-08 DIAGNOSIS — F1290 Cannabis use, unspecified, uncomplicated: Secondary | ICD-10-CM | POA: Diagnosis not present

## 2017-07-08 DIAGNOSIS — J069 Acute upper respiratory infection, unspecified: Secondary | ICD-10-CM | POA: Diagnosis not present

## 2017-07-08 DIAGNOSIS — F1121 Opioid dependence, in remission: Secondary | ICD-10-CM

## 2017-07-08 MED ORDER — BUPRENORPHINE HCL-NALOXONE HCL 2-0.5 MG SL SUBL: 1.0000 | SUBLINGUAL_TABLET | Freq: Every day | SUBLINGUAL | 0 refills | Status: DC

## 2017-07-08 NOTE — Assessment & Plan Note (Signed)
Doing well on the Suboxone 8mg  - 2mg  daily.  She has no issues and no cravings.  We discussed her weaning down to 4mg -1mg  in the future and she seems amenable to this plan.  She has well controlled MH disorder which is monitored at [facility].   Plan Continue buprenorphine-naloxone 8mg -2mg  once daily, Rx provided today UDS today [place] narcotic database appropriate Follow up in 2 weeks.

## 2017-07-08 NOTE — Progress Notes (Signed)
   07/08/2017  Tammy Barr presents for follow up of opioid use disorder I have reviewed the prior induction visit, follow up visits, and telephone encounters relevant to opiate use disorder (OUD) treatment.   Current daily dose: 8mg  daily  Date of Induction: April 2018  Current follow up interval, in weeks: 1 week  The patient has been adherent with the buprenorphine for OUD contract.   Last UDS Result: Buprenorphine and THC (reported to me by patient).  Interestingly, she has not had Paxil in her urine, she reports taking daily.    HPI: Tammy Barr is a person with moderate OUD in partial remission who is actively weaning off of buprenorphine.  She has been in MAT for about 10 months, previously with Dr. Mirna Mires.  She has recently started coming to our clinic.  She is taking buprenorphine-naloxone 8mg  daily and has no cravings.  She feels that she is doing well.  She is complaining of some tooth pain today in her left lower jaw.  She is due to see the dentist today.  She tells me that they want to pull 11 of her teeth, but this makes her nervous so she has not agreed to it.  She no longer has medicaid, but has [employer] medicare.  She is curious how much the suboxone will cost.  She has no other complaints today.   ROS + URI symptoms, + tooth pain.  - chest pain, fever, chills, cravings  Exam:   Vitals:   07/08/17 1112  BP: 132/79  Pulse: 67  Resp: 20  Temp: 97.7 F (36.5 C)  TempSrc: Oral  SpO2: 100%  Weight: 161 lb 14.4 oz (73.4 kg)    Gen: Flat affect, but pleasant HENT: She has gum swelling of molar on the left lower jaw, some gum recession of preceding teeth.  No obvious abscess or drainage.  No facial swelling Pulm: Breathing comfortably, no audible wheezing Psych: Very flat affect, alert, oriented.    Assessment/Plan:  See Problem Based Charting in the Encounters Tab  Tooth Pain - Likely with cavity/gingivitis.  She is due to see dentist today.  She requested information  on inexpensive dentist and phone number for county dental services was provided to her.       Sid Falcon, MD  07/08/2017  11:44 AM

## 2017-07-08 NOTE — Patient Instructions (Signed)
Ms. Huneke - -  Thanks for coming in to see [place].    Continue your buprenorphine-naloxone 1 tablet daily.  We will discuss further weaning of your medication at next visit.   Please come back to see me in 2 weeks.    Thank you!

## 2017-07-09 ENCOUNTER — Telehealth: Payer: Self-pay | Admitting: Internal Medicine

## 2017-07-09 NOTE — Telephone Encounter (Signed)
Patient dentist would like to know if you she can get a tooth pull, it is really badly infection, concern with patient bp. Pls call pt

## 2017-07-10 ENCOUNTER — Other Ambulatory Visit: Payer: Self-pay

## 2017-07-10 ENCOUNTER — Encounter: Payer: Self-pay | Admitting: Internal Medicine

## 2017-07-10 ENCOUNTER — Ambulatory Visit (INDEPENDENT_AMBULATORY_CARE_PROVIDER_SITE_OTHER): Payer: Medicare Other | Admitting: Internal Medicine

## 2017-07-10 VITALS — BP 126/76 | HR 87 | Temp 97.8°F | Ht 67.0 in | Wt 156.4 lb

## 2017-07-10 DIAGNOSIS — I10 Essential (primary) hypertension: Secondary | ICD-10-CM | POA: Diagnosis not present

## 2017-07-10 DIAGNOSIS — F17210 Nicotine dependence, cigarettes, uncomplicated: Secondary | ICD-10-CM

## 2017-07-10 DIAGNOSIS — Z79899 Other long term (current) drug therapy: Secondary | ICD-10-CM | POA: Diagnosis not present

## 2017-07-10 NOTE — Patient Instructions (Signed)
Tammy Barr it was nice meeting you today.  Continue taking lisinopril-hydrochlorothiazide at 20-25 mg once daily.  FOLLOW-UP INSTRUCTIONS When: 3-6 months For: Regular follow-up with PCP What to bring: Medications

## 2017-07-10 NOTE — Telephone Encounter (Signed)
Seen today in clinic.

## 2017-07-10 NOTE — Assessment & Plan Note (Signed)
BP Readings from Last 3 Encounters:  07/10/17 126/76  07/08/17 132/79  07/01/17 123/79    Lab Results  Component Value Date   NA 137 11/13/2016   K 4.4 11/13/2016   CREATININE 0.75 11/13/2016    Assessment: Blood pressure control:  Well-controlled Comments: Current medication regimen includes lisinopril-hydrochlorothiazide 20-25 mg daily.  States she is here to get a letter for her dentist's office stating her blood pressure is normal.  States she went to the dentist's office yesterday where her blood pressure was 146/99.  She was advised to follow-up at our clinic.  Patient is waiting to get a dental extraction done.  She is a current smoker with 30-pack-year history.  States she has nicotine patches at home but is not ready to quit smoking.  Reports drinking 4 cups of coffee every day.  Plan: Medications:  continue current medications Educational resources provided:  Since patient is not ready to set a quit date, advised her to cut down on the number of cigarettes she smokes every day.  Also advised her to reduce her daily caffeine intake. Other plans: Letter stating patient has a normal blood pressure has been faxed to [place].  Patient was in a rush and did not want to stop by the lab today.  BMP has been ordered to assess renal function and electrolyte status.  She will return to the clinic tomorrow to get her blood drawn.

## 2017-07-10 NOTE — Progress Notes (Signed)
   CC: Hypertension follow-up  HPI:  Tammy Barr is a 57 y.o. female with a past medical history of conditions listed below presenting to the clinic for follow-up of hypertension. Please see problem based charting for the status of the patient's current and chronic medical conditions.   Past Medical History:  Diagnosis Date  . Anxiety   . Arthritis   . Bipolar 1 disorder ([place])   . Depression   . Diabetes mellitus type II   . Hyperlipidemia   . OCD (obsessive compulsive disorder)   . Polysubstance abuse (HCC)    hx of  . Polysubstance abuse ([place])    remote  . Tobacco abuse    Review of Systems: Pertinent positives mentioned in HPI. Remainder of all ROS negative.   Physical Exam:  Vitals:   07/10/17 1522 07/10/17 1526  BP: 126/76   Pulse: 87   Temp: 97.8 F (36.6 C)   TempSrc: Oral   SpO2: 100% 100%  Weight: 156 lb 6.4 oz (70.9 kg)   Height: 5\' 7"  (1.702 m)    Physical Exam  Constitutional: She is oriented to person, place, and time. She appears well-developed and well-nourished. No distress.  HENT:  Head: Normocephalic and atraumatic.  Eyes: Right eye exhibits no discharge. Left eye exhibits no discharge.  Cardiovascular: Normal rate, regular rhythm and intact distal pulses.  Pulmonary/Chest: Effort normal and breath sounds normal. No respiratory distress. She has no wheezes. She has no rales.  Abdominal: Soft. Bowel sounds are normal.  Musculoskeletal: She exhibits no edema.  Neurological: She is alert and oriented to person, place, and time.  Skin: Skin is warm and dry.    Assessment & Plan:   See Encounters Tab for problem based charting.  Patient discussed with Dr. Evette Doffing

## 2017-07-11 NOTE — Progress Notes (Signed)
Internal Medicine Clinic Attending  Case discussed with Dr. Rathoreat the time of the visit. We reviewed the resident's history and exam and pertinent patient test results. I agree with the assessment, diagnosis, and plan of care documented in the resident's note.  

## 2017-07-13 LAB — TOXASSURE SELECT,+ANTIDEPR,UR

## 2017-07-21 ENCOUNTER — Telehealth: Payer: Self-pay | Admitting: *Deleted

## 2017-07-21 ENCOUNTER — Other Ambulatory Visit: Payer: Self-pay | Admitting: Internal Medicine

## 2017-07-21 DIAGNOSIS — F1121 Opioid dependence, in remission: Secondary | ICD-10-CM

## 2017-07-21 MED ORDER — BUPRENORPHINE HCL-NALOXONE HCL 2-0.5 MG SL SUBL: 1.0000 | SUBLINGUAL_TABLET | Freq: Every day | SUBLINGUAL | 0 refills | Status: DC

## 2017-07-21 NOTE — Progress Notes (Signed)
Sent in 1 week Rx electronically to previous pharmacy on file.    Thanks

## 2017-07-21 NOTE — Telephone Encounter (Signed)
Will refill X 1 week only, she must come in the next week!   Thanks

## 2017-07-21 NOTE — Telephone Encounter (Signed)
Pt calls and states she got a cold over the weekend and wants to wait to come to OUD until next week, wants you "to just call my medicine in"

## 2017-07-28 ENCOUNTER — Other Ambulatory Visit: Payer: Self-pay

## 2017-07-28 NOTE — Telephone Encounter (Signed)
lubiprostone (AMITIZA) 24 MCG capsule, refill request @ [facility] [name].

## 2017-07-29 ENCOUNTER — Other Ambulatory Visit: Payer: Self-pay | Admitting: *Deleted

## 2017-07-29 ENCOUNTER — Ambulatory Visit (INDEPENDENT_AMBULATORY_CARE_PROVIDER_SITE_OTHER): Payer: Medicare Other | Admitting: Internal Medicine

## 2017-07-29 ENCOUNTER — Telehealth: Payer: Self-pay | Admitting: *Deleted

## 2017-07-29 ENCOUNTER — Other Ambulatory Visit: Payer: Self-pay | Admitting: Internal Medicine

## 2017-07-29 DIAGNOSIS — K0889 Other specified disorders of teeth and supporting structures: Secondary | ICD-10-CM

## 2017-07-29 DIAGNOSIS — F419 Anxiety disorder, unspecified: Secondary | ICD-10-CM | POA: Diagnosis not present

## 2017-07-29 DIAGNOSIS — Z973 Presence of spectacles and contact lenses: Secondary | ICD-10-CM

## 2017-07-29 DIAGNOSIS — F1121 Opioid dependence, in remission: Secondary | ICD-10-CM | POA: Diagnosis not present

## 2017-07-29 MED ORDER — BUPRENORPHINE HCL-NALOXONE HCL 2-0.5 MG SL SUBL: 2.0000 | SUBLINGUAL_TABLET | Freq: Every day | SUBLINGUAL | 0 refills | Status: DC

## 2017-07-29 MED ORDER — LUBIPROSTONE 24 MCG PO CAPS: 24.0000 ug | ORAL_CAPSULE | Freq: Two times a day (BID) | ORAL | 0 refills | Status: DC

## 2017-07-29 NOTE — Assessment & Plan Note (Signed)
Doing well.  She was inadvertently weaned to 2mg -0.5mg  daily and has actually done surprisingly well. She has been on treatment now for 10 months and feels that she is doing well.  Given her recent anxiety increase due to dental work and her breakthrough withdrawal, we increased her dose to 4mg -1mg  daily.   Plan Increase buprenorphine-naloxone to 4mg -1mg  daily No UDS today, at next visit.  [place] narcotic database appropriate Follow up in 2 weeks.

## 2017-07-29 NOTE — Progress Notes (Signed)
   07/29/2017  Tammy Barr presents for follow up of opioid use disorder I have reviewed the prior induction visit, follow up visits, and telephone encounters relevant to opiate use disorder (OUD) treatment.   Current daily dose: 2mg  daily of buprenorphine  Date of Induction: April 2018   Current follow up interval, in weeks: 2 weeks  The patient has been adherent with the buprenorphine for OUD contract.   Last UDS Result: + for buprenorphine and THC  HPI: Tammy Barr is a 57yo woman with OUD who presents for OBOT.  She is on once daily dosing of buprenorphine and reports that she is doing well.  Today she reports that she had dental work a couple of weeks ago which was very traumatic for her.  She had only local anesthesia and her anxiety was very high.  She also only received motrin after her surgery, which is not the norm for her.  She notes with her last provider, she would go off of buprenorphine for 2 days, take tylenol #3 during the worst of the pain, and then start back buprenorphine.  I advised her that we would only need to coordinate with her oral surgeon next time and this could be done again.    We discussed weaning off the buprenorphine and she is not sure she is ready.  She was prescribed buprenorphine-naloxone 2mg -0.5mg  daily at last visit and she notes some breakthrough nausea, shakiness and sweating right before her dose.  We discussed continuing this dose or going back up to 4mg  and she would prefer to try the 4mg .    ROS: + anxiety.  Tooth pain improved.  Exam:   Vitals:   07/29/17 0915  BP: 124/80  Pulse: 65  Resp: 20  Temp: 98.4 F (36.9 C)  TempSrc: Oral  SpO2: 99%  Weight: 154 lb 6.4 oz (70 kg)  Height: 5\' 7"  (1.702 m)    General: Anxious appearing woman, NAD, very conversant.  Eyes: Wearing glasses, anicteric sclerae Pulm: Breathing comfortably in the room, no audible wheezing Psych: + anxious, alert, oriented.   Assessment/Plan:  See Problem Based  Charting in the Encounters Tab  Tooth Pain - Improved with dental work.  Advised her to call the clinic and coordinate care with her next surgery/dental work if she should need in the future.  Pain is well controlled today with advil and tylenol.    Sid Falcon, MD  07/29/2017  9:55 AM

## 2017-07-29 NOTE — Patient Instructions (Signed)
Ms. Azer - -  Given you might be having break through withdrawal, we will increase the buprenorphine-naloxone to 4mg -1mg  daily.    Thank you!  Come back in 2 weeks.

## 2017-07-29 NOTE — Telephone Encounter (Signed)
rx refilled today per Dr Charlynn Grimes.

## 2017-07-29 NOTE — Telephone Encounter (Signed)
Pharmacist has a question about the prescription

## 2017-07-29 NOTE — Telephone Encounter (Signed)
I don't see this on her medication list or when it was started. Will fill for 1 month. She needs follow up in clinic. She was suppose to follow up in November and never has been seen again outside the opioid clinic.   Thanks.

## 2017-07-29 NOTE — Telephone Encounter (Signed)
Pt calls and states when she went to pharmacy to get suboxone filled that she was only given 7 tabs and was told dr Daryll Drown needed to send in another script, she states she is trying to get everything at [facility] [address] [place] and had been using [facility] [place]  Called [facility], and was told pt just today picked up the script that was sent 2/26. Called [facility] [place] [address] and they have filled the new script from today but pt has not picked up. Ask the pharmacist to put script on hold until he is notified by [facility], he is agreeable. Called dr Daryll Drown made her aware, she was agreeable to the hold, will address this 3/6

## 2017-07-29 NOTE — Telephone Encounter (Signed)
[facility] pharmacist wanted to if Suboxone  is prescribed for addiction - informed yes.

## 2017-07-30 ENCOUNTER — Telehealth: Payer: Self-pay | Admitting: Internal Medicine

## 2017-07-30 ENCOUNTER — Other Ambulatory Visit: Payer: Self-pay | Admitting: Internal Medicine

## 2017-07-30 NOTE — Telephone Encounter (Signed)
Spoke to pt, gave her instructions as to suboxone dose, will [facility] and ask them to release script from 3/5 on mon 3/11. Have changed pharm preference to [facility] [address]. [facility] instructed pharmacist to release suboxone 3/11 and gave her the amitiza verbally.

## 2017-07-30 NOTE — Telephone Encounter (Signed)
Agree with plan to hold refill.  She can certainly pick up the refill at [address] once she runs out of the 7 tablets from [facility].  This needs to be addressed at next visit, b/c it would seem from refill history that she is not taking daily medication.  She may not need daily and may continue to wean down.  Defer to next appointment.

## 2017-07-30 NOTE — Telephone Encounter (Signed)
Patient is requesting Tammy Barr call back, she will be out of medicines

## 2017-07-31 ENCOUNTER — Ambulatory Visit: Payer: Medicare Other

## 2017-07-31 NOTE — Telephone Encounter (Signed)
I have spoken to pt on this matter, please see other notes

## 2017-08-05 ENCOUNTER — Telehealth: Payer: Self-pay

## 2017-08-05 NOTE — Telephone Encounter (Signed)
Would like Tammy Barr to call back.  

## 2017-08-05 NOTE — Telephone Encounter (Signed)
buprenorphine-naloxone (SUBOXONE) 2-0.5 mg SUBL SL tablet, Refill request

## 2017-08-06 ENCOUNTER — Other Ambulatory Visit: Payer: Self-pay | Admitting: Internal Medicine

## 2017-08-06 NOTE — Telephone Encounter (Signed)
Have spoken to pharm, see previous note

## 2017-08-06 NOTE — Telephone Encounter (Signed)
Patient is requesting suboxone, she been without it for two days [phone]

## 2017-08-06 NOTE — Telephone Encounter (Signed)
Called pharm, they state they missed the note to refill 3/11, they are correcting and filling now and will call pt

## 2017-08-06 NOTE — Telephone Encounter (Signed)
Pt called back, states the pharmacy already notified her the Suboxone is ready for pick up.

## 2017-08-12 ENCOUNTER — Ambulatory Visit (INDEPENDENT_AMBULATORY_CARE_PROVIDER_SITE_OTHER): Payer: Medicare Other | Admitting: Student in an Organized Health Care Education/Training Program

## 2017-08-12 VITALS — BP 119/72 | HR 80 | Temp 97.7°F | Ht 67.0 in | Wt 157.9 lb

## 2017-08-12 DIAGNOSIS — K0889 Other specified disorders of teeth and supporting structures: Secondary | ICD-10-CM

## 2017-08-12 DIAGNOSIS — F1120 Opioid dependence, uncomplicated: Secondary | ICD-10-CM

## 2017-08-12 DIAGNOSIS — K08409 Partial loss of teeth, unspecified cause, unspecified class: Secondary | ICD-10-CM | POA: Diagnosis not present

## 2017-08-12 DIAGNOSIS — F1121 Opioid dependence, in remission: Secondary | ICD-10-CM

## 2017-08-12 MED ORDER — BUPRENORPHINE HCL-NALOXONE HCL 2-0.5 MG SL SUBL: 2.0000 | SUBLINGUAL_TABLET | Freq: Every day | SUBLINGUAL | 0 refills | Status: DC

## 2017-08-12 NOTE — Progress Notes (Signed)
   08/12/2017  Tammy Barr presents for follow up of opioid use disorder I have reviewed the prior induction visit, follow up visits, and telephone encounters relevant to opiate use disorder (OUD) treatment.   Current daily dose: Suboxone 4 mg once daily  Date of Induction: April 2018  Current follow up interval, in weeks: 2 weeks  The patient has been adherent with the buprenorphine for OUD contract.   Last UDS Result: Appropriate  HPI: 57 year old woman here for follow-up of opioid use disorder.  Patient is doing well today on current dosing of 4 mg once daily.  When she weaned down to 2 mg once daily she says she was feeling a lot of withdrawal type associated.  Really fatigued and hard to do anything throughout the day.  She feels much better on the 4 mg dosing.  She wants to stick with this dosing for now.  Reports cravings are well controlled.  She has had no relapses  in many months.  About 3 weeks ago she had multiple teeth pulled which was a very difficult procedure for her.  She feels like her pain is now much better controlled as she is feeling better.  Her dentist is still recommending more teeth to be pulled but she wants to hold off because of how difficult the procedure is.  Exam:   Vitals:   08/12/17 1047  BP: 119/72  Pulse: 80  Temp: 97.7 F (36.5 C)  TempSrc: Oral  SpO2: 99%  Weight: 157 lb 14.4 oz (71.6 kg)  Height: 5\' 7"  (1.702 m)    General: Well-appearing woman in no distress ENT: Chronically poor dentition Neck: No thyromegaly or JVD Heart: Regular rate and rhythm with no murmurs Extremities: Warm well perfused with no edema.  Assessment/Plan:  See Problem Based Charting in the Encounters Tab     Axel Filler, MD  08/12/2017  2:13 PM

## 2017-08-12 NOTE — Assessment & Plan Note (Addendum)
Moderate opioid use disorder in remission on treatment with Suboxone for about 1.  She is doing well since transitioning OBOT to our clinic.  Plan is to continue with Suboxone 4 mg once daily to avoid withdrawal and manage cravings.  Urine drug testing has been appropriate.  I reviewed the controlled substance reporting system which was also appropriate.  Follow-up with me in 2 weeks.  She does well at next visit I think we can see her just once monthly for Suboxone prescribing.  Previously she had consider tapering off completely Suboxone, however I think we will hold at the 4 mg daily dosing indefinitely for now.

## 2017-08-26 ENCOUNTER — Ambulatory Visit (INDEPENDENT_AMBULATORY_CARE_PROVIDER_SITE_OTHER): Payer: Medicare Other | Admitting: Internal Medicine

## 2017-08-26 DIAGNOSIS — F1121 Opioid dependence, in remission: Secondary | ICD-10-CM

## 2017-08-26 MED ORDER — BUPRENORPHINE HCL-NALOXONE HCL 2-0.5 MG SL SUBL: 2.0000 | SUBLINGUAL_TABLET | Freq: Every day | SUBLINGUAL | 0 refills | Status: DC

## 2017-08-26 NOTE — Assessment & Plan Note (Signed)
Moderate OUD in remission.  Doing well, cravings controlled on 4mg  of suboxone.  I reviewed controlled substance reporting system and is appropriate.  Will have her follow up in 4 weeks, she has an appointment with PCP Charlynn Grimes that day, we can likely see her at that visit with Dr Charlynn Grimes.

## 2017-08-26 NOTE — Progress Notes (Signed)
   08/26/2017  Tammy Barr presents for follow up of opioid use disorder I have reviewed the prior induction visit, follow up visits, and telephone encounters relevant to opiate use disorder (OUD) treatment.   Current daily dose: Suboxone 4 mg once daily  Date of Induction: April 2018  Current follow up interval, in weeks: 4 weeks  The patient has been adherent with the buprenorphine for OUD contract.   Last UDS Result: Appropriate  HPI: 57 year old woman here for follow-up of opioid use disorder.  Patient is doing well today on current dosing of 4 mg once daily.  Previously when she weaned down to 2 mg once daily she says she was feeling a lot of withdrawal type associated.  Really fatigued and hard to do anything throughout the day.  She feels much better on the 4 mg dosing. Cravings continue to be well controlled.  She has had no relapses in many months. She has completed her dental work "for now" and feels she is doing well. She notes new relationship which has her in better mood.  Exam:   Vitals:   08/26/17 0958  BP: 117/75  Pulse: 71  Resp: 20  Temp: 97.8 F (36.6 C)  TempSrc: Oral  SpO2: 96%  Weight: 159 lb 9.6 oz (72.4 kg)    General: Well-appearing woman in no distress Heart: Regular rate and rhythm with no murmurs Lungs: CTAB Extremities: Warm well perfused with no edema.  Assessment/Plan:  See Problem Based Charting in the Encounters Tab     Tammy Groves, DO  08/26/2017  10:22 AM

## 2017-08-26 NOTE — Patient Instructions (Signed)
We will see you back in 4 weeks.  Continue on the 4mg  dose.

## 2017-09-22 ENCOUNTER — Other Ambulatory Visit: Payer: Self-pay | Admitting: *Deleted

## 2017-09-23 MED ORDER — LUBIPROSTONE 24 MCG PO CAPS: 24.0000 ug | ORAL_CAPSULE | Freq: Two times a day (BID) | ORAL | 2 refills | Status: DC

## 2017-09-23 NOTE — Assessment & Plan Note (Addendum)
Lab Results  Component Value Date   HGBA1C 5.2 09/24/2017   HGBA1C 5.4 03/19/2017   HGBA1C 5.5 10/02/2016    Hemoglobin A1c 5.2 today.  Patient is on no medications and is diet controlled.  Assessment and plan: Continue to monitor annually.

## 2017-09-23 NOTE — Assessment & Plan Note (Signed)
BP Readings from Last 3 Encounters:  08/26/17 117/75  08/12/17 119/72  07/29/17 124/80    Lab Results  Component Value Date   NA 137 11/13/2016   K 4.4 11/13/2016   CREATININE 0.75 11/13/2016   Currently prescribed lisinopril-hctz 20-25 mg daily. She reports compliance with her medications. Denies any headaches, chest pain, vision changes, shortness of breath.   A/P Check BMET today Continue current medications

## 2017-09-23 NOTE — Progress Notes (Signed)
   CC: Hypertension  HPI:  Ms.Mariaguadalupe A Beirne is a 57 y.o. female with a past medical history listed below here today for follow up of her hypertension.  For details of today's visit and the status of her chronic medical issues please refer to the assessment and plan.   Past Medical History:  Diagnosis Date  . Anxiety   . Arthritis   . Bipolar 1 disorder ([place])   . Depression   . Diabetes mellitus type II   . Hyperlipidemia   . OCD (obsessive compulsive disorder)   . Polysubstance abuse ([facility])    hx of  . Polysubstance abuse ([place])    remote  . Tobacco abuse    Review of Systems: No chest pain or shortness of breath  Physical Exam:  Vitals:   09/24/17 1358  BP: 125/72  Pulse: 74  Temp: 98.6 F (37 C)  TempSrc: Oral  SpO2: 98%  Weight: 153 lb 12.8 oz (69.8 kg)  Height: 5\' 7"  (1.702 m)   GENERAL- alert, co-operative, appears as stated age, not in any distress. HEENT- Atraumatic, normocephalic, PERRL, EOMI, oral mucosa appears moist CARDIAC- RRR, no murmurs, rubs or gallops. RESP- Moving equal volumes of air, and clear to auscultation bilaterally, no wheezes or crackles. ABDOMEN- Soft, nontender, bowel sounds present. NEURO- No obvious Cr N abnormality. EXTREMITIES- pulse 2+, symmetric, no pedal edema. SKIN- Warm, dry, No rash or lesion. PSYCH- Normal mood and affect, appropriate thought content and speech.  Assessment & Plan:   See Encounters Tab for problem based charting.  Patient discussed with Dr. Angelia Mould

## 2017-09-24 ENCOUNTER — Encounter: Payer: Self-pay | Admitting: Internal Medicine

## 2017-09-24 ENCOUNTER — Other Ambulatory Visit: Payer: Self-pay

## 2017-09-24 ENCOUNTER — Ambulatory Visit (INDEPENDENT_AMBULATORY_CARE_PROVIDER_SITE_OTHER): Payer: Medicare Other | Admitting: Internal Medicine

## 2017-09-24 VITALS — BP 125/72 | HR 74 | Temp 98.6°F | Ht 67.0 in | Wt 153.8 lb

## 2017-09-24 DIAGNOSIS — E119 Type 2 diabetes mellitus without complications: Secondary | ICD-10-CM | POA: Diagnosis not present

## 2017-09-24 DIAGNOSIS — F1121 Opioid dependence, in remission: Secondary | ICD-10-CM

## 2017-09-24 DIAGNOSIS — Z79899 Other long term (current) drug therapy: Secondary | ICD-10-CM

## 2017-09-24 DIAGNOSIS — Z8639 Personal history of other endocrine, nutritional and metabolic disease: Secondary | ICD-10-CM | POA: Diagnosis not present

## 2017-09-24 DIAGNOSIS — Z0000 Encounter for general adult medical examination without abnormal findings: Secondary | ICD-10-CM

## 2017-09-24 DIAGNOSIS — I10 Essential (primary) hypertension: Secondary | ICD-10-CM | POA: Diagnosis not present

## 2017-09-24 LAB — GLUCOSE, CAPILLARY: Glucose-Capillary: 130 mg/dL — ABNORMAL HIGH (ref 65–99)

## 2017-09-24 LAB — POCT GLYCOSYLATED HEMOGLOBIN (HGB A1C): HEMOGLOBIN A1C: 5.2

## 2017-09-24 MED ORDER — LISINOPRIL-HYDROCHLOROTHIAZIDE 10-12.5 MG PO TABS: 1.0000 | ORAL_TABLET | Freq: Every day | ORAL | 2 refills | Status: DC

## 2017-09-24 MED ORDER — BUPRENORPHINE HCL-NALOXONE HCL 2-0.5 MG SL SUBL: 2.0000 | SUBLINGUAL_TABLET | Freq: Every day | SUBLINGUAL | 0 refills | Status: AC

## 2017-09-24 MED ORDER — IBUPROFEN 800 MG PO TABS: 800.0000 mg | ORAL_TABLET | Freq: Three times a day (TID) | ORAL | 2 refills | Status: DC | PRN

## 2017-09-24 NOTE — Patient Instructions (Addendum)
Tammy Barr,  Letter doing well today.  Everything is looking good on exam today.  We will check some labs today.    I discussed with Dr. Heber Hendrix and he is agreeable to being seen by your regular physician every 3 months or so.  He can only prescribe 1 month supply of your Suboxone at a time.  Please call the clinic at least 2 days prior to running out to get a refill and he can send that in electronically for you.  If you have any problems please call the clinic.  Otherwise, please follow-up with your new regular physician in 3 months.

## 2017-09-25 LAB — BMP8+ANION GAP
Anion Gap: 16 mmol/L (ref 10.0–18.0)
BUN/Creatinine Ratio: 18 (ref 9–23)
BUN: 15 mg/dL (ref 6–24)
CO2: 23 mmol/L (ref 20–29)
Calcium: 9.7 mg/dL (ref 8.7–10.2)
Chloride: 101 mmol/L (ref 96–106)
Creatinine, Ser: 0.83 mg/dL (ref 0.57–1.00)
GFR calc Af Amer: 91 mL/min/{1.73_m2} (ref >59)
GFR calc non Af Amer: 79 mL/min/{1.73_m2} (ref >59)
Glucose: 112 mg/dL — ABNORMAL HIGH (ref 65–99)
Potassium: 4 mmol/L (ref 3.5–5.2)
Sodium: 140 mmol/L (ref 134–144)

## 2017-09-25 LAB — LIPID PANEL
Chol/HDL Ratio: 4 ratio (ref 0.0–4.4)
Cholesterol, Total: 184 mg/dL (ref 100–199)
HDL: 46 mg/dL (ref >39)
LDL Calculated: 116 mg/dL — ABNORMAL HIGH (ref 0–99)
Triglycerides: 108 mg/dL (ref 0–149)
VLDL Cholesterol Cal: 22 mg/dL (ref 5–40)

## 2017-09-27 ENCOUNTER — Encounter: Payer: Self-pay | Admitting: Internal Medicine

## 2017-09-27 NOTE — Assessment & Plan Note (Signed)
Patient overdue for colonoscopy.  Will need to be addressed at follow-up visit.

## 2017-09-27 NOTE — Assessment & Plan Note (Addendum)
ASCVD risk is 14.2% based on today's lipid panel.  She has had intolerance to cholesterol medicine according to previous notes due to worsening joint pain.  Unclear what she is tried in the past.  Will need to discuss options statin versus ezetimibe at follow-up office visit.

## 2017-09-27 NOTE — Assessment & Plan Note (Signed)
She is doing well and her cravings continue to be controlled on 4 mg of Suboxone nightly.  Her controlled substance reporting system is appropriate.  Will check a UDS today as it is been several months since her last check.  Gust with Dr. Heber [place] today.  He is agreeable to provide prescription for her Suboxone today.  She can get 1 month today and will call in at least 2 days prior to running out for refills. She will follow every 3 months currently as she is doing quite well.

## 2017-09-29 LAB — TOXASSURE SELECT,+ANTIDEPR,UR

## 2017-09-30 NOTE — Progress Notes (Signed)
Internal Medicine Clinic Attending  Case discussed with Dr. Boswell at the time of the visit.  We reviewed the resident's history and exam and pertinent patient test results.  I agree with the assessment, diagnosis, and plan of care documented in the resident's note.  

## 2017-10-21 NOTE — Addendum Note (Signed)
Addended by: Shela Leff on: 10/21/2017 05:06 PM   Modules accepted: Orders

## 2017-11-23 ENCOUNTER — Encounter: Payer: Self-pay | Admitting: *Deleted

## 2018-01-05 ENCOUNTER — Telehealth: Payer: Self-pay | Admitting: *Deleted

## 2018-01-05 NOTE — Telephone Encounter (Signed)
Needs to be seen, ACC in PM tomorrow would work for me.

## 2018-01-05 NOTE — Telephone Encounter (Signed)
8/13 acc [id number]

## 2018-01-05 NOTE — Telephone Encounter (Signed)
Pt calls and states she needs a refill of suboxone, states dr boswell was filling her suboxone and she has weaned herself to 1/2 tablet daily but is out at this time. She was last seen 5/1 and given a month script for suboxone, uds was done that day and + thc. States she wont be able to be seen until oct or nov by Laural Golden so if she could get a refill of suboxone until then. Sending to dr's vincent, [employer]

## 2018-01-06 ENCOUNTER — Other Ambulatory Visit: Payer: Self-pay

## 2018-01-06 ENCOUNTER — Ambulatory Visit (INDEPENDENT_AMBULATORY_CARE_PROVIDER_SITE_OTHER): Payer: Medicare Other | Admitting: Internal Medicine

## 2018-01-06 ENCOUNTER — Ambulatory Visit: Payer: Medicare Other

## 2018-01-06 VITALS — BP 129/78 | HR 72 | Temp 99.4°F | Ht 67.0 in | Wt 158.5 lb

## 2018-01-06 DIAGNOSIS — R339 Retention of urine, unspecified: Secondary | ICD-10-CM | POA: Diagnosis not present

## 2018-01-06 DIAGNOSIS — F1121 Opioid dependence, in remission: Secondary | ICD-10-CM

## 2018-01-06 DIAGNOSIS — F1120 Opioid dependence, uncomplicated: Secondary | ICD-10-CM

## 2018-01-06 DIAGNOSIS — Z8619 Personal history of other infectious and parasitic diseases: Secondary | ICD-10-CM

## 2018-01-06 MED ORDER — BUPRENORPHINE HCL-NALOXONE HCL 2-0.5 MG SL SUBL: 1.0000 | SUBLINGUAL_TABLET | Freq: Every day | SUBLINGUAL | 0 refills | Status: DC

## 2018-01-06 NOTE — Assessment & Plan Note (Signed)
The relapse late for the patient off of Suboxone is pretty high.  However, the patient was abusive only to oral pain medications which are now strongly controlled.  Review of the [place] controlled substance database shows that the patient's last oral medication was Tylenol 3 prescribed on 8/9 for her dental procedure which is appropriate.  Congratulated the patient on her progress with Suboxone.  Encouraged her to complete her Suboxone 4 mg dosage (she has 10 tablets left) and then subsequently take Suboxone 2 mg tablets for another 14 days.   Told the patient that she can return to clinic or call whenever she has questions regarding her dosage or if she feels like she will relapse.

## 2018-01-06 NOTE — Progress Notes (Signed)
   01/06/2018  Tammy Barr presents for follow up of opioid use disorder I have reviewed the prior induction visit, follow up visits, and telephone encounters relevant to opiate use disorder (OUD) treatment.   Current daily dose: Suboxone 4mg  once daily  Date of Induction: April 2018  Current follow up interval, in weeks: 4 weeks  The patient has been adherent with the buprenorphine for OUD contract.   Last UDS Result: 09/24/2017 appropriate except for carboxy-THC  HPI:  Tammy Barr is a 57 year old female with retention, history of hepatitis C that is cured, opioid use disorder who presents for IUD follow-up.  The patient was initially abusing oral pain medications (oxycodone) due to pain from her Hepatitis C and subsequently enrolled in our OUD clinic in April 2018. She has no history of IVDU. The patient has been adherent with her suboxone therapy and doe snot have any difficulty with it.   The patient expressed that she would like to be weaned off of suboxone due to it interfering with her sleep and also due to it altering her bowel habits. She states that sometimes she "fears not being able to go to the bathroom".   The patient was prescribed Suboxone 8 mg tablets to take 2 times a day #56 her last appointment in May.  The patient has self titrated herself.  She initially started taking 1 tablet (Suboxone 8 mg) for 1 month and then subsequently took half a tablet (Suboxone on 4 mg) for the subsequent month.    The patient returns to clinic today to be seen regarding titrating her self off of Suboxone completely.  Exam:   Vitals:   01/06/18 1352  BP: 129/78  Pulse: 72  Temp: 99.4 F (37.4 C)  TempSrc: Oral  SpO2: 97%  Weight: 158 lb 8 oz (71.9 kg)  Height: 5\' 7"  (1.702 m)   Physical Exam  Constitutional: She appears well-developed and well-nourished. No distress.  HENT:  Head: Normocephalic and atraumatic.  Eyes: Conjunctivae are normal.  Cardiovascular: Normal rate,  regular rhythm and normal heart sounds.  Respiratory: Effort normal and breath sounds normal. No respiratory distress. She has no wheezes.  GI: Soft. Bowel sounds are normal. She exhibits no distension. There is no tenderness.  Musculoskeletal: She exhibits no edema.  Neurological: She is alert.  Skin: She is not diaphoretic. No erythema.  Psychiatric: She has a normal mood and affect. Her behavior is normal. Judgment and thought content normal.    Assessment/Plan:  See Problem Based Charting in the Encounters Tab   Lars Mage, MD  01/06/2018  2:06 PM

## 2018-01-06 NOTE — Patient Instructions (Signed)
It was a pleasure to see you today Tammy Barr. Congratulations on your great work! Please finish your suboxone 1/2 tablets (4mg ) daily and then take the suboxone 2mg  daily. Please let us know if know if you ever feel that you might relapse. Thank you for allowing [place] to be a part of your care.   If you have any questions or concerns, please call our clinic at [phone] between 9am-5pm and after hours call [phone] and ask for the internal medicine resident on call. If you feel you are having a medical emergency please call 911.   Thank you, we look forward to help you remain healthy!  Lars Mage, MD Internal Medicine PGY2

## 2018-01-08 ENCOUNTER — Telehealth: Payer: Self-pay

## 2018-01-08 NOTE — Telephone Encounter (Signed)
Rtc, lm for rtc 

## 2018-01-08 NOTE — Telephone Encounter (Signed)
Questions about med. Please call pt back.  

## 2018-01-08 NOTE — Progress Notes (Signed)
Internal Medicine Clinic Attending  I saw and evaluated the patient.  I personally confirmed the key portions of the history and exam documented by Dr. Maricela Bo and I reviewed pertinent patient test results.  The assessment, diagnosis, and plan were formulated together and I agree with the documentation in the resident's note.  Moderate OUD well controlled, patient now wanting to come off suboxone. I gave her a final course of 2mg  daily, after that she should be able to stop with minimal withdrawal side effect.

## 2018-01-13 NOTE — Telephone Encounter (Signed)
rtc to pt, discussed appts, amitiza and f/u's she will call for appt if needed

## 2018-01-24 ENCOUNTER — Emergency Department (HOSPITAL_COMMUNITY)
Admission: EM | Admit: 2018-01-24 | Discharge: 2018-01-25 | Disposition: A | Discharge status: Home / Self Care | Payer: Medicare Other | Attending: Emergency Medicine | Admitting: Emergency Medicine

## 2018-01-24 ENCOUNTER — Encounter (HOSPITAL_COMMUNITY): Payer: Self-pay | Admitting: Emergency Medicine

## 2018-01-24 DIAGNOSIS — Z79899 Other long term (current) drug therapy: Secondary | ICD-10-CM | POA: Diagnosis not present

## 2018-01-24 DIAGNOSIS — S20312A Abrasion of left front wall of thorax, initial encounter: Secondary | ICD-10-CM | POA: Insufficient documentation

## 2018-01-24 DIAGNOSIS — Y998 Other external cause status: Secondary | ICD-10-CM | POA: Insufficient documentation

## 2018-01-24 DIAGNOSIS — F17210 Nicotine dependence, cigarettes, uncomplicated: Secondary | ICD-10-CM | POA: Insufficient documentation

## 2018-01-24 DIAGNOSIS — E119 Type 2 diabetes mellitus without complications: Secondary | ICD-10-CM | POA: Insufficient documentation

## 2018-01-24 DIAGNOSIS — Y9389 Activity, other specified: Secondary | ICD-10-CM | POA: Insufficient documentation

## 2018-01-24 DIAGNOSIS — I10 Essential (primary) hypertension: Secondary | ICD-10-CM | POA: Insufficient documentation

## 2018-01-24 DIAGNOSIS — F1121 Opioid dependence, in remission: Secondary | ICD-10-CM | POA: Insufficient documentation

## 2018-01-24 DIAGNOSIS — Y92410 Unspecified street and highway as the place of occurrence of the external cause: Secondary | ICD-10-CM | POA: Diagnosis not present

## 2018-01-24 DIAGNOSIS — R51 Headache: Secondary | ICD-10-CM | POA: Diagnosis not present

## 2018-01-24 DIAGNOSIS — F319 Bipolar disorder, unspecified: Secondary | ICD-10-CM | POA: Diagnosis not present

## 2018-01-24 DIAGNOSIS — S301XXA Contusion of abdominal wall, initial encounter: Secondary | ICD-10-CM | POA: Insufficient documentation

## 2018-01-24 LAB — CBG MONITORING, ED: GLUCOSE-CAPILLARY: 89 mg/dL (ref 70–99)
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
## 2018-01-25 ENCOUNTER — Emergency Department (HOSPITAL_COMMUNITY): Payer: Medicare Other

## 2018-01-25 NOTE — Discharge Instructions (Signed)
All imaging today looked great. Can take tylenol or motrin for pain. Follow-up with your primary care doctor. Return here for new concerns.

## 2018-01-25 NOTE — ED Notes (Signed)
Pt want put cuff on so we can check pressure. She just stated she wanted to leave. PA AWARE AND GETTING PAPERS READY.

## 2018-01-25 NOTE — ED Notes (Signed)
Patient transported to X-ray 
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
## 2018-02-04 ENCOUNTER — Ambulatory Visit: Payer: Medicare Other

## 2018-02-04 ENCOUNTER — Telehealth: Payer: Self-pay | Admitting: *Deleted

## 2018-02-04 NOTE — Telephone Encounter (Signed)
Pt called / informed of Dr Doristine Section response. Stated she will keep her appt next week.

## 2018-02-04 NOTE — Telephone Encounter (Signed)
Okay.  We can reinitiate her on 9/27.  No refills over the phone until she re-establishes in clinic.   Thanks
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
## 2018-02-09 ENCOUNTER — Ambulatory Visit (INDEPENDENT_AMBULATORY_CARE_PROVIDER_SITE_OTHER): Payer: Medicare Other | Admitting: Internal Medicine

## 2018-02-09 ENCOUNTER — Other Ambulatory Visit: Payer: Self-pay

## 2018-02-09 DIAGNOSIS — M546 Pain in thoracic spine: Secondary | ICD-10-CM | POA: Diagnosis not present

## 2018-02-09 DIAGNOSIS — F1121 Opioid dependence, in remission: Secondary | ICD-10-CM

## 2018-02-09 DIAGNOSIS — Z791 Long term (current) use of non-steroidal anti-inflammatories (NSAID): Secondary | ICD-10-CM

## 2018-02-09 DIAGNOSIS — R197 Diarrhea, unspecified: Secondary | ICD-10-CM

## 2018-02-09 DIAGNOSIS — H9192 Unspecified hearing loss, left ear: Secondary | ICD-10-CM

## 2018-02-09 DIAGNOSIS — R0981 Nasal congestion: Secondary | ICD-10-CM

## 2018-02-09 DIAGNOSIS — M545 Low back pain: Secondary | ICD-10-CM

## 2018-02-09 DIAGNOSIS — F319 Bipolar disorder, unspecified: Secondary | ICD-10-CM

## 2018-02-09 DIAGNOSIS — M2669 Other specified disorders of temporomandibular joint: Secondary | ICD-10-CM

## 2018-02-09 DIAGNOSIS — R110 Nausea: Secondary | ICD-10-CM

## 2018-02-09 DIAGNOSIS — M26649 Arthritis of unspecified temporomandibular joint: Secondary | ICD-10-CM

## 2018-02-09 DIAGNOSIS — I10 Essential (primary) hypertension: Secondary | ICD-10-CM

## 2018-02-09 MED ORDER — CYCLOBENZAPRINE HCL 5 MG PO TABS: 5.0000 mg | ORAL_TABLET | Freq: Three times a day (TID) | ORAL | 0 refills | Status: DC | PRN

## 2018-02-09 MED FILL — CYCLOBENZAPRINE 5 MG TABLET: 5 | 10 days supply | Qty: 30 | Fill #0
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
## 2018-02-09 NOTE — Assessment & Plan Note (Signed)
Patient with multiple areas of MSK pain; no signs or symptoms at this time of tendon tears or intraarticular trauma. Pain seems incompletely resolved with ibuprofen/tylenol. I offered switching to naproxen but she declined.  Plan: --continue ibuprofen/tylenol --short course of flexeril 5mg  TID PRN --advised use of heat/ice throughout the day; advised use of OTC aspercreme
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
## 2018-02-09 NOTE — Patient Instructions (Signed)
For your pain, continue using ibuprofen and tylenol. Try ice and heat and over the counter Aspercreme to help with the pain. I have prescribed a short course of muscle relaxant, flexeril; you can take up to 3 times a day if needed. Please don't drive or operate machinery until you see how it affects you; may make you dizzy, sleepy, uncoordinated.   Please schedule an appointment with your oral surgeon for your TMJ pain. Use heat/ice to help with it as well.

## 2018-02-10 NOTE — Progress Notes (Signed)
Internal Medicine Clinic Attending  Case discussed with Dr. Svalina  at the time of the visit.  We reviewed the resident's history and exam and pertinent patient test results.  I agree with the assessment, diagnosis, and plan of care documented in the resident's note.  

## 2018-02-13 ENCOUNTER — Ambulatory Visit (INDEPENDENT_AMBULATORY_CARE_PROVIDER_SITE_OTHER): Payer: Medicare Other | Admitting: *Deleted

## 2018-02-13 DIAGNOSIS — Z23 Encounter for immunization: Secondary | ICD-10-CM

## 2018-03-11 ENCOUNTER — Ambulatory Visit (INDEPENDENT_AMBULATORY_CARE_PROVIDER_SITE_OTHER): Payer: Medicare Other | Admitting: Internal Medicine

## 2018-03-11 ENCOUNTER — Encounter: Payer: Self-pay | Admitting: Internal Medicine

## 2018-03-11 ENCOUNTER — Other Ambulatory Visit: Payer: Self-pay

## 2018-03-11 VITALS — BP 136/77 | HR 74 | Temp 98.6°F | Ht 67.0 in | Wt 157.9 lb

## 2018-03-11 DIAGNOSIS — I10 Essential (primary) hypertension: Secondary | ICD-10-CM

## 2018-03-11 DIAGNOSIS — F319 Bipolar disorder, unspecified: Secondary | ICD-10-CM | POA: Diagnosis not present

## 2018-03-11 DIAGNOSIS — Z8639 Personal history of other endocrine, nutritional and metabolic disease: Secondary | ICD-10-CM

## 2018-03-11 DIAGNOSIS — Z8619 Personal history of other infectious and parasitic diseases: Secondary | ICD-10-CM

## 2018-03-11 DIAGNOSIS — Z79899 Other long term (current) drug therapy: Secondary | ICD-10-CM

## 2018-03-11 DIAGNOSIS — F1190 Opioid use, unspecified, uncomplicated: Secondary | ICD-10-CM

## 2018-03-11 DIAGNOSIS — Z23 Encounter for immunization: Secondary | ICD-10-CM

## 2018-03-11 DIAGNOSIS — Z1211 Encounter for screening for malignant neoplasm of colon: Secondary | ICD-10-CM

## 2018-03-11 DIAGNOSIS — E785 Hyperlipidemia, unspecified: Secondary | ICD-10-CM | POA: Diagnosis not present

## 2018-03-11 MED ORDER — LISINOPRIL-HYDROCHLOROTHIAZIDE 10-12.5 MG PO TABS: 1.0000 | ORAL_TABLET | Freq: Every day | ORAL | 2 refills | Status: DC

## 2018-03-11 NOTE — Assessment & Plan Note (Addendum)
Patient wanted a repeat Hgb A1c and was told her most recent A1c in 05/19 was 5.2. Will recheck in ~6 months.

## 2018-03-11 NOTE — Patient Instructions (Signed)
Ms. Keir,  It was a pleasure meeting you today! I am so glad to see you are doing well and all the success you've made. We will plan to follow up in 3-4 months but of course sooner if anything comes up!  I look forward to seeing you again!

## 2018-03-11 NOTE — Assessment & Plan Note (Addendum)
BP has been well controlled on lisinopril-hctz 10-12.5. She has been compliant with her medications. Denies any headaches, flushing, chest pain, or changes in her vision.  - continue current medications

## 2018-03-11 NOTE — Progress Notes (Signed)
   CC: HTN  HPI:  [name] is a 57 y.o. female with history of hepatitis C that is cured, type II DM, HLD, bipolar disorder, opioid use disorder who presents for medication refill and to establish care with new PCP.  For details of today's visit and the status of her chronic medical issues please refer to the assessment and plan.   Past Medical History:  Diagnosis Date  . Anxiety   . Arthritis   . Bipolar 1 disorder ([place])   . Depression   . Diabetes mellitus ([place]) 04/16/2007   Mild     . Diabetes mellitus type II   . Hyperlipidemia   . OCD (obsessive compulsive disorder)   . Polysubstance abuse ([facility])    hx of  . Polysubstance abuse ([place])    remote  . Tobacco abuse    Review of Systems:   Review of Systems  Respiratory: Negative for shortness of breath.   Cardiovascular: Negative for chest pain and leg swelling.  Gastrointestinal: Negative for abdominal pain, nausea and vomiting.  Neurological: Negative for headaches.    Physical Exam:  Vitals:   03/11/18 1502  BP: 136/77  Pulse: 74  Temp: 98.6 F (37 C)  TempSrc: Oral  SpO2: 98%  Weight: 157 lb 14.4 oz (71.6 kg)  Height: 5\' 7"  (1.702 m)   Physical Exam  Constitutional: She is oriented to person, place, and time.  Cardiovascular: Normal rate, regular rhythm and normal heart sounds.  No murmur heard. Pulmonary/Chest: Effort normal and breath sounds normal. No respiratory distress. She has no wheezes.  Musculoskeletal: She exhibits no edema.  Neurological: She is alert and oriented to person, place, and time.  Skin: Skin is warm and dry.    Assessment & Plan:   See Encounters Tab for problem based charting.  Patient seen with Dr. Evette Doffing

## 2018-03-12 NOTE — Progress Notes (Signed)
Internal Medicine Clinic Attending  I saw and evaluated the patient.  I personally confirmed the key portions of the history and exam documented by Dr.  Rehman  and I reviewed pertinent patient test results.  The assessment, diagnosis, and plan were formulated together and I agree with the documentation in the resident's note.  

## 2018-06-25 ENCOUNTER — Other Ambulatory Visit: Payer: Self-pay | Admitting: Internal Medicine

## 2018-06-25 DIAGNOSIS — I10 Essential (primary) hypertension: Secondary | ICD-10-CM

## 2018-06-25 MED ORDER — LISINOPRIL-HYDROCHLOROTHIAZIDE 10-12.5 MG PO TABS: 1.0000 | ORAL_TABLET | Freq: Every day | ORAL | 2 refills | Status: DC

## 2018-06-25 NOTE — Progress Notes (Signed)
Patient called asking for refills of Lisinopril-HCTZ 10-12.5mg  to be sent to [facility] on [address].  These were re-ordered.

## 2018-06-28 ENCOUNTER — Telehealth: Payer: Self-pay | Admitting: Internal Medicine

## 2018-06-28 DIAGNOSIS — I10 Essential (primary) hypertension: Secondary | ICD-10-CM

## 2018-06-28 MED ORDER — LISINOPRIL-HYDROCHLOROTHIAZIDE 10-12.5 MG PO TABS: 1.0000 | ORAL_TABLET | Freq: Every day | ORAL | 3 refills | Status: DC

## 2018-06-28 NOTE — Telephone Encounter (Signed)
   Reason for call:   I received a call from [name] at 4:10 PM indicating needing refill.   Pertinent Data:   Patient states she called her pharmacy for her refills and they stated her Lisinopril-HCTZ pill had already been picked up. This was recently ordered for her on 06/25/18.    Assessment / Plan / Recommendations:   Old prescription was discontinued and new prescription order and sent to [facility] for her     Neva Seat, MD   06/28/2018, 4:15 PM

## 2018-08-19 ENCOUNTER — Ambulatory Visit (INDEPENDENT_AMBULATORY_CARE_PROVIDER_SITE_OTHER): Payer: Medicare Other | Admitting: Internal Medicine

## 2018-08-19 ENCOUNTER — Telehealth: Payer: Self-pay | Admitting: Internal Medicine

## 2018-08-19 ENCOUNTER — Other Ambulatory Visit: Payer: Self-pay

## 2018-08-19 DIAGNOSIS — J018 Other acute sinusitis: Secondary | ICD-10-CM | POA: Insufficient documentation

## 2018-08-19 MED ORDER — AMOXICILLIN-POT CLAVULANATE 875-125 MG PO TABS: 1.0000 | ORAL_TABLET | Freq: Two times a day (BID) | ORAL | 0 refills | Status: AC

## 2018-08-19 NOTE — Telephone Encounter (Signed)
I will call her this afternoon, thanks

## 2018-08-19 NOTE — Patient Instructions (Signed)
Patient was advised to take Augmentin for 7 days and continue using NSAID's for pain management and sinus hygiene. If her symptoms do not improve or worsen she was advised to call the [facility] and schedule a visit.

## 2018-08-19 NOTE — Telephone Encounter (Signed)
Pt had a dental procedure x 2 weeks ago and contacted her Dentist and a  Museum/gallery conservator.  Pt was asked to contact her PCP due to no relief after trying OTC meds.    Pt having right eye pain, right temple pain and right ear pain that won't stop.

## 2018-08-19 NOTE — Progress Notes (Signed)
   CC: Right eye, ear and temple pain  HPI:  [name] is a 58 y.o. with a PMHx listed below calling about right eye, ear and temple pain.   This is a telephone encounter between Tammy Barr and [name] on 08/19/2018 for right eye, ear and temple pain. The visit was conducted with the patient located at home and Tammy Barr at [facility]. The patient's identity was confirmed using their DOB and current address. The patient has consented to being evaluated through a telephone encounter and understands the associated risks/benefits. I personally spent 16 on medical discussion.   For details of today's visit and the status of his chronic medical issues please refer to the assessment and plan.   Past Medical History:  Diagnosis Date  . Anxiety   . Arthritis   . Bipolar 1 disorder ([place])   . Depression   . Diabetes mellitus ([place]) 04/16/2007   Mild     . Diabetes mellitus type II   . Hyperlipidemia   . OCD (obsessive compulsive disorder)   . Polysubstance abuse (HCC)    hx of  . Polysubstance abuse ([name])    remote  . Tobacco abuse    Review of Systems:   Review of Systems  Constitutional: Negative for chills and fever.  HENT: Positive for ear pain and sinus pain. Negative for congestion, ear discharge, hearing loss and tinnitus.   Eyes: Positive for pain. Negative for blurred vision, photophobia, discharge and redness.  Respiratory: Negative for cough and sputum production.   Neurological: Positive for headaches.    Physical Exam: Physical exam was not obtained due to this being a telemedicine encounter.  There were no vitals filed for this visit.   Assessment & Plan:   See Encounters Tab for problem based charting.  Patient discussed with Dr. Evette Doffing

## 2018-08-19 NOTE — Assessment & Plan Note (Signed)
Patient reports she has been having right eye, ear and temple pain for the past ~2-3 weeks. She states she had an oral surgery on March 2nd; however, her symptoms preceded her tooth extraction. She was given a short course of amoxicillin for both prior to the surgery and after. She states she had a total of 21 pills but is not sure how many days she took this medicine. She states her symptoms have gotten worse. She has tried OTC pain medications with some relief. She called her dentist who referred her to calling her PCP. She thinks this is a sinus infection, similar to sinus infections she has had in the past. Discussed starting Augmentin with good sinus hygiene with nasal irrigation and NSAID's for pain control. She is amenable to this plan. If her symptoms do not improve or worsen she was advised to call us back and we will consider an office visit.  Plan: - Augmentin for 7 days - NSAID's for pain control - Sinus hygiene

## 2018-08-19 NOTE — Progress Notes (Signed)
Internal Medicine Clinic Attending  Case discussed with Dr. Laural Golden.  We reviewed the resident's history and exam and pertinent patient test results.  I agree with the assessment, diagnosis, and plan of care documented in the resident's note.

## 2018-08-19 NOTE — Addendum Note (Signed)
Addended by: Lalla Brothers T on: 08/19/2018 04:05 PM   Modules accepted: Level of Service

## 2018-08-24 ENCOUNTER — Other Ambulatory Visit: Payer: Self-pay

## 2018-08-24 DIAGNOSIS — J018 Other acute sinusitis: Secondary | ICD-10-CM

## 2018-08-24 DIAGNOSIS — F1121 Opioid dependence, in remission: Secondary | ICD-10-CM

## 2018-08-24 NOTE — Telephone Encounter (Addendum)
Pt was rx'd  Augmentin 875-125 for 7 days 08/19/18-sinus infection.  Pt states pcp told instructed her to call if she needs another rx.  Pt states her "sinus infection" is better, but feels like she needs a few additional days to fully resolve it.  Pt is also requesting a refill on ibuprofen 800mg  .  Will send to pcp for review.Tammy Barr, Tammy Erskin Cassady3/30/20203:32 PM  Please contact pt if you require additional information.

## 2018-08-24 NOTE — Telephone Encounter (Signed)
amoxicillin-clavulanate (AUGMENTIN) 875-125 MG tablet    ibuprofen (ADVIL,MOTRIN) 800 MG tablet   , refill request @  [facility], [place] [phone] (Phone) [phone] (Fax)

## 2018-08-25 MED ORDER — IBUPROFEN 800 MG PO TABS: 800.0000 mg | ORAL_TABLET | Freq: Three times a day (TID) | ORAL | 2 refills | Status: DC | PRN

## 2018-08-25 NOTE — Telephone Encounter (Signed)
Patient completed the course of antibiotics that is recommended for sinusitis. She was afebrile with no productive sputum so she should not need anymore antibiotics and continue to improve. I will refill her ibuprofen. Thanks!

## 2018-11-22 ENCOUNTER — Encounter: Payer: Self-pay | Admitting: *Deleted
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
## 2019-03-10 ENCOUNTER — Encounter: Payer: Medicare Other | Admitting: Internal Medicine

## 2019-04-09 ENCOUNTER — Ambulatory Visit (INDEPENDENT_AMBULATORY_CARE_PROVIDER_SITE_OTHER): Payer: Medicare Other | Admitting: Internal Medicine

## 2019-04-09 ENCOUNTER — Other Ambulatory Visit: Payer: Self-pay

## 2019-04-09 DIAGNOSIS — B851 Pediculosis due to Pediculus humanus corporis: Secondary | ICD-10-CM

## 2019-04-09 DIAGNOSIS — B850 Pediculosis due to Pediculus humanus capitis: Secondary | ICD-10-CM

## 2019-04-09 DIAGNOSIS — B852 Pediculosis, unspecified: Secondary | ICD-10-CM | POA: Insufficient documentation

## 2019-04-09 MED ORDER — SPINOSAD 0.9 % EX SUSP: 1.0000 "application " | Freq: Once | CUTANEOUS | 0 refills | Status: AC

## 2019-04-09 NOTE — Progress Notes (Signed)
  [facility] Internal Medicine Residency Telephone Encounter Continuity Care Appointment  HPI:   This telephone encounter was created for [name] on 04/09/2019 for the following purpose/cc: Lice. [name] was evaluated via telephone. She mentions that about a week ago she went to her friend's house to help move her furniture and ever since she has been having itchy scalp with visualization of 'white specks.' She states she used OTC permethrin shampoo with wet combing with temporary benefit. She states her symptoms have re-occurred with significant itching and she states her 'scalp can't take it anymore.' She is now also having itching in her pubic area and back of her neck as well. She denies any sexual contact. She states she also used her shampoo on her pets at home.    Past Medical History:  Past Medical History:  Diagnosis Date  . Anxiety   . Arthritis   . Bipolar 1 disorder ([place])   . Depression   . Diabetes mellitus ([place]) 04/16/2007   Mild     . Diabetes mellitus type II   . Hyperlipidemia   . OCD (obsessive compulsive disorder)   . Polysubstance abuse (HCC)    hx of  . Polysubstance abuse ([place])    remote  . Tobacco abuse      ROS:  Review of Systems  Constitutional: Negative for chills, fever and malaise/fatigue.  Eyes: Negative for blurred vision.  Gastrointestinal: Negative for constipation, diarrhea, nausea and vomiting.  Skin: Positive for itching. Negative for rash.     Assessment / Plan / Recommendations:   Please see A&P under problem oriented charting for assessment of the patient's acute and chronic medical conditions.   As always, pt is advised that if symptoms worsen or new symptoms arise, they should go to an urgent care facility or to to ER for further evaluation.   Consent and Medical Decision Making:   Patient discussed with Dr. Dareen Piano  This is a telephone encounter between Tammy Barr and Tammy Barr on 04/09/2019 for itchy scalp.  The visit was conducted with the patient located at home and Tammy Barr at [facility]. The patient's identity was confirmed using their DOB and current address. The patient has consented to being evaluated through a telephone encounter and understands the associated risks (an examination cannot be done and the patient may need to come in for an appointment) / benefits (allows the patient to remain at home, decreasing exposure to coronavirus). I personally spent 12 minutes on medical discussion.

## 2019-04-09 NOTE — Assessment & Plan Note (Signed)
Presents on telephone visit w/ complaint of itchy scalp. Mentions visualizing louse. Treated w/ permethrin and wet comb at home with improvement. Symptoms re-occurred now with wider area of infection. Requesting alternative treatment. Hx consistent w/ head + body louse but unable to confirm due to nature of telephone visit. Possible to have permethrin resistant lice. Will offer alternative therapies.  - Counseled on importance of hygiene and bed/pillow washing - Start spinosad + permethrin therapy - If resistant, may need to trial oral ivermectin

## 2019-04-14 NOTE — Progress Notes (Signed)
Internal Medicine Clinic Attending ° °Case discussed with Dr. Lee at the time of the visit.  We reviewed the resident’s history and exam and pertinent patient test results.  I agree with the assessment, diagnosis, and plan of care documented in the resident’s note.  °

## 2019-05-05 ENCOUNTER — Ambulatory Visit: Payer: Medicare Other

## 2019-06-29 ENCOUNTER — Telehealth: Payer: Self-pay | Admitting: Internal Medicine

## 2019-06-29 ENCOUNTER — Ambulatory Visit (INDEPENDENT_AMBULATORY_CARE_PROVIDER_SITE_OTHER): Payer: Medicare Other | Admitting: Internal Medicine

## 2019-06-29 ENCOUNTER — Encounter: Payer: Self-pay | Admitting: Internal Medicine

## 2019-06-29 ENCOUNTER — Telehealth: Payer: Self-pay

## 2019-06-29 ENCOUNTER — Other Ambulatory Visit: Payer: Self-pay

## 2019-06-29 ENCOUNTER — Ambulatory Visit: Payer: Medicare Other | Admitting: Internal Medicine

## 2019-06-29 VITALS — BP 141/90 | HR 73 | Temp 97.5°F | Ht 67.0 in | Wt 140.0 lb

## 2019-06-29 DIAGNOSIS — B852 Pediculosis, unspecified: Secondary | ICD-10-CM

## 2019-06-29 MED ORDER — IVERMECTIN 3 MG PO TABS: 200.0000 ug/kg | ORAL_TABLET | ORAL | 0 refills | Status: AC

## 2019-06-29 NOTE — Progress Notes (Signed)
  [facility] Internal Medicine Residency Telephone Encounter Continuity Care Appointment  HPI:   This telephone encounter was created for [name] on 06/29/2019 for the following purpose/cc lice infestation.   Past Medical History:  Past Medical History:  Diagnosis Date  . Anxiety   . Arthritis   . Bipolar 1 disorder ([place])   . Depression   . Diabetes mellitus ([place]) 04/16/2007   Mild     . Diabetes mellitus type II   . Hyperlipidemia   . OCD (obsessive compulsive disorder)   . Polysubstance abuse (HCC)    hx of  . Polysubstance abuse ([place])    remote  . Tobacco abuse       ROS:   Positive for itching and rash.    Assessment / Plan / Recommendations:   Please see A&P under problem oriented charting for assessment of the patient's acute and chronic medical conditions.   As always, pt is advised that if symptoms worsen or new symptoms arise, they should go to an urgent care facility or to to ER for further evaluation.   Consent and Medical Decision Making:   Patient discussed with Dr. Angelia Mould  This is a telephone encounter between Tammy Barr and Welford Roche on 06/29/2019 for problem stated above. The visit was conducted with the patient located at home and Welford Roche at [facility]. The patient's identity was confirmed using their DOB and current address. The patient has consented to being evaluated through a telephone encounter and understands the associated risks (an examination cannot be done and the patient may need to come in for an appointment) / benefits (allows the patient to remain at home, decreasing exposure to coronavirus). I personally spent 10 minutes on medical discussion.

## 2019-06-29 NOTE — Telephone Encounter (Signed)
Pt missed a call, pls return call for telehealth appt [phone]

## 2019-06-29 NOTE — Telephone Encounter (Signed)
Opened in error. SChaplin, RN,BSN  

## 2019-06-29 NOTE — Patient Instructions (Signed)
Tammy Barr,   I sent a medication called ivermectin to your pharmacy.  You will take 4 tablets today and then 4 tablets in 1 week.  This should complete your treatment.  Please let us know if you have any questions or concerns.  -Dr. Frederico Hamman

## 2019-06-29 NOTE — Assessment & Plan Note (Signed)
Patient reports she has been dealing with a lice infestation for the past 3 months. She has tried spinosad and permethrin shampoo several times with no resolution in symptoms. She reports she can still see the organism in her eye lashes, hair, and nose. Describes it as dark specs in her hair and small white eggs. She states she researched it online and thinks it is a demadex mite and not lice. She is requesting treatment for this.  - Advise to come to clinic for further evaluation, appt scheduled for today at 3:45PM

## 2019-06-30 ENCOUNTER — Encounter: Payer: Self-pay | Admitting: Internal Medicine

## 2019-06-30 NOTE — Progress Notes (Signed)
   CC: Lice infestation   HPI:  Tammy Barr is a 59 y.o. year-old female with PMH listed below who presents to clinic for lice infestaiton. Please see problem based assessment and plan for further details.   Past Medical History:  Diagnosis Date  . Anxiety   . Arthritis   . Bipolar 1 disorder ([place])   . Depression   . Diabetes mellitus ([place]) 04/16/2007   Mild     . Diabetes mellitus type II   . Hyperlipidemia   . OCD (obsessive compulsive disorder)   . Polysubstance abuse (HCC)    hx of  . Polysubstance abuse ([place])    remote  . Tobacco abuse    Review of Systems:   Review of Systems  Constitutional: Negative for chills and fever.  Skin: Positive for itching. Negative for rash.    Physical Exam:  Vitals:   06/29/19 1543  BP: (!) 141/90  Pulse: 73  Temp: (!) 97.5 F (36.4 C)  TempSrc: Oral  SpO2: 98%  Weight: 140 lb (63.5 kg)  Height: 5\' 7"  (1.702 m)    General: Well-appearing female in no acute distress Derm: few excoriations in scalp from scratching, few dark and white speck over scalp, no live lice    Assessment & Plan:   See Encounters Tab for problem based charting.  Patient discussed with Dr. Angelia Mould

## 2019-06-30 NOTE — Progress Notes (Signed)
Internal Medicine Clinic Attending  Case discussed with Dr. Santos-Sanchez at the time of the visit.  We reviewed the resident's history and exam and pertinent patient test results.  I agree with the assessment, diagnosis, and plan of care documented in the resident's note.    

## 2019-06-30 NOTE — Assessment & Plan Note (Signed)
Patient presents for evaluation of lice infestation.  Reports symptoms started 3 months ago.  She has tried spinosad and permethrin shampoo several times with no improvement in symptoms. On exam, I did not appreciate live lice (though did not use fine-tooth comb) but she does have dark and white specks over her scalp consistent with lice.  - Weight based ivermectin 12,000 mcg x 2 doses

## 2019-07-04 NOTE — Progress Notes (Signed)
Internal Medicine Clinic Attending  Case discussed with Dr. Santos-Sanchez at the time of the visit.  We reviewed the resident's history and exam and pertinent patient test results.  I agree with the assessment, diagnosis, and plan of care documented in the resident's note.    

## 2019-07-12 ENCOUNTER — Telehealth: Payer: Self-pay | Admitting: Internal Medicine

## 2019-07-12 NOTE — Telephone Encounter (Signed)
Pt wants a refill on medicine for the mices ;pls contact [facility], [place]

## 2019-07-12 NOTE — Telephone Encounter (Signed)
Pt informed front office she was re-infected with lice when she went to a friend's house. Requesting another rx. Thanks

## 2019-07-12 NOTE — Telephone Encounter (Signed)
Spoke w/ dr Isac Sarna, will not refill til seen in clinic Called pt back and scheduled 2/16 at [facility]

## 2019-07-13 ENCOUNTER — Ambulatory Visit (INDEPENDENT_AMBULATORY_CARE_PROVIDER_SITE_OTHER): Payer: Medicare Other | Admitting: Internal Medicine

## 2019-07-13 ENCOUNTER — Other Ambulatory Visit: Payer: Self-pay

## 2019-07-13 ENCOUNTER — Encounter: Payer: Self-pay | Admitting: Internal Medicine

## 2019-07-13 DIAGNOSIS — B852 Pediculosis, unspecified: Secondary | ICD-10-CM | POA: Diagnosis not present

## 2019-07-13 NOTE — Progress Notes (Signed)
Internal Medicine Clinic Attending  Case discussed with Dr. Agyei at the time of the visit.  We reviewed the resident's history and exam and pertinent patient test results.  I agree with the assessment, diagnosis, and plan of care documented in the resident's note.    

## 2019-07-13 NOTE — Patient Instructions (Signed)
Tammy Barr,   Thanks for seeing [place] today. It could possibly be that the bugs may be fleas since you said your dog also had it.  My recommendation is to have your dog treated. For yourself, you can try DEET-based bug sprays.  Thanks!

## 2019-07-13 NOTE — Assessment & Plan Note (Signed)
?  Lice/Flea infestation: Of note, she has presented to the clinic several times for evaluation of lice infestation. Namely, 04/09/2019 (TeleHealth), 06/29/2019 (TeleHealth), 06/29/2019 (In-person). Prior treatment has included spinosad, permethrin therapy and ivermectin 12,000 mcg x 2 doses.  Today, she states that she still feels the bugs crawling in her hair.  Sometimes they get in her ear, nose, eyelashes and her genital area as well as her legs.  She does mention that she noticed similar bugs on her dog and got treated for it.  She also had her house exterminated about a month ago.  I did a thorough examination of her hair, nose, eyes and ear.  I was not able to visibly see any bugs.  She did not have lesions in the webspaces of her fingers.  Assessment/plan: Her complaints may be possibly related to fleas and she has been given instructions as stated in the AVS including having her dog treated and also using DEET-based bug sprays.

## 2019-07-13 NOTE — Progress Notes (Signed)
   CC: Lice infestation   HPI:  Tammy Barr is a 59 y.o. with medical problems listed below presenting for evaluation of lice infestation.  Please see problem based charting for further details.      Past Medical History:  Diagnosis Date  . Anxiety   . Arthritis   . Bipolar 1 disorder (Tammy Barr)   . Depression   . Diabetes mellitus (Tammy Barr) 04/16/2007   Mild     . Diabetes mellitus type II   . Hyperlipidemia   . OCD (obsessive compulsive disorder)   . Polysubstance abuse (Tammy Barr)    hx of  . Polysubstance abuse (Tammy Barr)    remote  . Tobacco abuse    Review of Systems:  As per HPI  Physical Exam:  Vitals:   07/13/19 0909  BP: 135/87  Pulse: 70  Temp: 97.9 F (36.6 C)  TempSrc: Oral  SpO2: 96%  Weight: 158 lb (71.7 kg)  Height: 5\' 7"  (1.702 m)   Physical Exam  Constitutional: She is well-developed, well-nourished, and in no distress.  HENT:  Head: Normocephalic and atraumatic.  Nose: Nose normal.  Skin: Skin is warm. No rash noted. Rash is not macular, not papular, not pustular, not vesicular and not urticarial. No cyanosis or erythema. No pallor. Nails show no clubbing.    Assessment & Plan:   See Encounters Tab for problem based charting.  Patient discussed with Tammy Barr

## 2019-08-12 ENCOUNTER — Ambulatory Visit: Payer: Medicare Other | Attending: Internal Medicine

## 2019-08-12 DIAGNOSIS — Z23 Encounter for immunization: Secondary | ICD-10-CM

## 2019-08-12 NOTE — Progress Notes (Signed)
   Covid-19 Vaccination Clinic  Name:  Tammy Barr    MRN: [id number] DOB: 1961-01-05  08/12/2019  [name] was observed post Covid-19 immunization for 15 minutes without incident. She was provided with Vaccine Information Sheet and instruction to access the V-Safe system.   [name] was instructed to call 911 with any severe reactions post vaccine: Marland Kitchen Difficulty breathing  . Swelling of face and throat  . A fast heartbeat  . A bad rash all over body  . Dizziness and weakness   Immunizations Administered    Name Date Dose VIS Date Route   Pfizer COVID-19 Vaccine 08/12/2019  9:34 AM 0.3 mL 05/07/2019 Intramuscular   Manufacturer: [place]   Lot: [id number]   [place]: [id number]

## 2019-09-06 ENCOUNTER — Ambulatory Visit: Payer: Medicare Other | Attending: Internal Medicine

## 2019-09-06 DIAGNOSIS — Z23 Encounter for immunization: Secondary | ICD-10-CM

## 2019-09-06 NOTE — Progress Notes (Signed)
   Covid-19 Vaccination Clinic  Name:  Tammy Barr    MRN: [id number] DOB: [birth year]  09/06/2019  [name] was observed post Covid-19 immunization for 15 minutes without incident. She was provided with Vaccine Information Sheet and instruction to access the V-Safe system.   [name] was instructed to call 911 with any severe reactions post vaccine: Marland Kitchen Difficulty breathing  . Swelling of face and throat  . A fast heartbeat  . A bad rash all over body  . Dizziness and weakness   Immunizations Administered    Name Date Dose VIS Date Route   Pfizer COVID-19 Vaccine 09/06/2019  9:46 AM 0.3 mL 05/07/2019 Intramuscular   Manufacturer: [place]   Lot: [id number]   [place]: [id number]

## 2019-09-09 ENCOUNTER — Telehealth: Payer: Self-pay | Admitting: Internal Medicine

## 2019-09-09 DIAGNOSIS — I10 Essential (primary) hypertension: Secondary | ICD-10-CM

## 2019-09-11 NOTE — Telephone Encounter (Signed)
Will refill BP meds.  Patient has been seen for acute care, but not seen in a year for continuity.  If I have availability, please schedule patient for continuity.

## 2019-09-13 NOTE — Telephone Encounter (Signed)
Unable to schedule patient for future continuity appt with PCP.  Forwarding back to Dr. Court Joy to make him aware that his first available will not be until July and that schedule is not in Epic yet.

## 2019-09-13 NOTE — Telephone Encounter (Signed)
Reviewed patient's recent lab work and okay to put off appointment until July.

## 2019-09-24 ENCOUNTER — Telehealth: Payer: Self-pay | Admitting: *Deleted

## 2019-09-24 NOTE — Telephone Encounter (Signed)
Patient is requesting another referral back to Infectious Disease.  States you are aware of issue she is having.

## 2019-09-27 NOTE — Telephone Encounter (Signed)
Spoke with the patient this morning.  She apologizes for the confusion about her referral. The patient states she needed to be seen for her eye irritation.  Pt states she had a [place] visit recently and she was seen and given an Eye Exam by [facility] in her home.  Pt has a copy of the Eye Exam Report and will get it to [place] as soon as possible.

## 2019-09-27 NOTE — Telephone Encounter (Signed)
I am not aware of why patient needs referral. On review of her chart she has been seen multiple times for a lice infection. She will need to be seen in [facility] to discuss appropriate treatment.

## 2019-10-06 ENCOUNTER — Telehealth: Payer: Self-pay | Admitting: *Deleted

## 2019-10-06 NOTE — Telephone Encounter (Signed)
RETURNED CALL TO MS Stemm REGARDING A REQUEST FOR ID CLINIC.  WILL LET DR Court Joy KNOW.

## 2019-11-26 ENCOUNTER — Other Ambulatory Visit: Payer: Self-pay

## 2019-11-26 ENCOUNTER — Encounter: Payer: Self-pay | Admitting: Internal Medicine

## 2019-11-26 ENCOUNTER — Ambulatory Visit (INDEPENDENT_AMBULATORY_CARE_PROVIDER_SITE_OTHER): Payer: Medicare Other | Admitting: Internal Medicine

## 2019-11-26 DIAGNOSIS — L989 Disorder of the skin and subcutaneous tissue, unspecified: Secondary | ICD-10-CM | POA: Diagnosis not present

## 2019-11-26 NOTE — Progress Notes (Signed)
° °  CC: Tick bite  HPI:  [name] is a 59 y.o. female with past medical history as listed below presenting for concerns of a sore on the back of her head for 3 weeks and recent tick bites from camping 1 week ago.  She denies any fevers or chills.  Please see problem based charting for complete assessment and plan.  Past Medical History:  Diagnosis Date   Anxiety    Arthritis    Bipolar 1 disorder ([place])    Depression    Diabetes mellitus ([place]) 04/16/2007   Mild      Diabetes mellitus type II    Hyperlipidemia    OCD (obsessive compulsive disorder)    Polysubstance abuse (HCC)    hx of   Polysubstance abuse ([place])    remote   Tobacco abuse    Review of Systems: Negative except as stated in HPI  Physical Exam:  Vitals:   11/26/19 1357  BP: 118/74  Pulse: 68  Temp: 98.1 F (36.7 C)  TempSrc: Oral  SpO2: 97%  Weight: 158 lb 14.4 oz (72.1 kg)  Height: 5\' 7"  (1.702 m)   Physical Exam  Constitutional: Appears well-developed and well-nourished. No distress.  Head: Normocephalic and atraumatic.  Small lesion noted at the posterior occipital region.  Appears to be scabbed over.  No signs of infection.  No area of fluctuation surrounding the lesion.  Eyes: Conjunctivae are normal.  Cardiovascular: Normal rate, regular rhythm and normal heart sounds.  Distal pulses intact Respiratory: Effort normal and breath sounds normal. No respiratory distress. No wheezes.  GI: Soft. Bowel sounds are normal. No distension. There is no tenderness.  Musculoskeletal: No edema.  Neurological: Is alert and oriented x4.  No apparent focal deficits noted.   Skin: Warm and dry.  Small area of erythema surrounding a tick bite without targetoid lesion on the left upper quadrant of abdomen and on mid lower back.  No tick found on examination.  Psychiatric: Normal mood and affect. Behavior is normal. Judgment and thought content normal.    Assessment & Plan:   See Encounters Tab for  problem based charting.  Patient discussed with Dr. Heber [place]

## 2019-11-26 NOTE — Patient Instructions (Signed)
Tammy Barr,  It was a pleasure seeing you in clinic. Today we discussed:  Tick bites:  You may use hydrocortisone cream for itching.  Please keep an eye on this to make sure it is not worsening.  If you develop any symptoms including fever, chills, facial weakness, limb weakness, chest pain, shortness of breath please let us know.  Head sore: This does not look to be infected at this time.  Please keep an eye on it and keep it clean and dry.  If it worsens please contact us.  If you have any questions or concerns, please call our clinic at [phone] between 9am-5pm and after hours call [phone] and ask for the internal medicine resident on call. If you feel you are having a medical emergency please call 911.   Thank you, we look forward to helping you remain healthy!

## 2019-11-26 NOTE — Assessment & Plan Note (Addendum)
Patient endorses a scalp lesion on posterior occipital region for approximately 3 weeks duration.  She denies any new lice, flea, or bedbug infestation.  She notes that every time it scabs over, she feels a scab off.  On examination, small lesion noted at the posterior occipital region.  Does not appear to be bleeding.  No surrounding erythema, edema, area of fluctuance.  Mild tenderness to palpation.  Discussed with patient that this is likely her prior bug bite that has scabbed over.  Advised her to keep it dry and clean and allow for healing process.  No acute interventions at this time  Patient also noted to tick bites on her left upper quadrant and mid back following a camping trip 1 week ago.  No tics noted on examination.  She is noted to have a small area of erythema surrounding the tick bite in the left upper quadrant and mid back without targetoid lesion noted.  Does not appear to be tender to palpation, no significant edema noted.  At this time, no indication for doxycycline treatment.  Patient advised to keep the area dry and clean.  Advised for hydrocortisone for itching.  Return precautions provided.

## 2019-11-30 NOTE — Progress Notes (Signed)
Internal Medicine Clinic Attending ° °Case discussed with Dr. Aslam  At the time of the visit.  We reviewed the resident’s history and exam and pertinent patient test results.  I agree with the assessment, diagnosis, and plan of care documented in the resident’s note.  °

## 2019-12-06 ENCOUNTER — Other Ambulatory Visit: Payer: Self-pay | Admitting: Internal Medicine

## 2019-12-06 DIAGNOSIS — I10 Essential (primary) hypertension: Secondary | ICD-10-CM

## 2019-12-08 ENCOUNTER — Other Ambulatory Visit: Payer: Self-pay | Admitting: Internal Medicine

## 2019-12-08 DIAGNOSIS — I10 Essential (primary) hypertension: Secondary | ICD-10-CM

## 2019-12-27 ENCOUNTER — Encounter: Payer: Medicare Other | Admitting: Internal Medicine

## 2020-03-08 ENCOUNTER — Other Ambulatory Visit: Payer: Self-pay | Admitting: Internal Medicine

## 2020-03-08 DIAGNOSIS — I10 Essential (primary) hypertension: Secondary | ICD-10-CM

## 2020-04-03 ENCOUNTER — Encounter: Payer: Medicare Other | Admitting: Internal Medicine

## 2020-06-10 ENCOUNTER — Other Ambulatory Visit: Payer: Self-pay | Admitting: Student

## 2020-06-10 DIAGNOSIS — I10 Essential (primary) hypertension: Secondary | ICD-10-CM

## 2020-06-13 NOTE — Telephone Encounter (Signed)
Last visit 11/26/19-appt request sent to front office

## 2020-06-15 ENCOUNTER — Encounter: Payer: Self-pay | Admitting: Internal Medicine

## 2020-07-19 ENCOUNTER — Encounter: Payer: Self-pay | Admitting: Internal Medicine

## 2020-07-19 ENCOUNTER — Ambulatory Visit (INDEPENDENT_AMBULATORY_CARE_PROVIDER_SITE_OTHER): Payer: Medicare Other | Admitting: Internal Medicine

## 2020-07-19 ENCOUNTER — Other Ambulatory Visit: Payer: Self-pay

## 2020-07-19 VITALS — BP 146/88 | HR 72 | Temp 98.5°F | Ht 67.0 in | Wt 162.5 lb

## 2020-07-19 DIAGNOSIS — Z1231 Encounter for screening mammogram for malignant neoplasm of breast: Secondary | ICD-10-CM

## 2020-07-19 DIAGNOSIS — I10 Essential (primary) hypertension: Secondary | ICD-10-CM | POA: Diagnosis not present

## 2020-07-19 DIAGNOSIS — F411 Generalized anxiety disorder: Secondary | ICD-10-CM | POA: Diagnosis not present

## 2020-07-19 DIAGNOSIS — Z1239 Encounter for other screening for malignant neoplasm of breast: Secondary | ICD-10-CM | POA: Insufficient documentation

## 2020-07-19 DIAGNOSIS — Z8639 Personal history of other endocrine, nutritional and metabolic disease: Secondary | ICD-10-CM

## 2020-07-19 MED ORDER — LISINOPRIL-HYDROCHLOROTHIAZIDE 10-12.5 MG PO TABS: 1.0000 | ORAL_TABLET | Freq: Every day | ORAL | 1 refills | Status: DC

## 2020-07-19 MED ORDER — CLONAZEPAM 0.5 MG PO TABS: 0.5000 mg | ORAL_TABLET | Freq: Every day | ORAL | 0 refills | Status: AC

## 2020-07-19 NOTE — Assessment & Plan Note (Addendum)
Screen with hemoglobin A1c. Orders placed , to be done in next 2 weeks. Patient GAD preventing her from getting lab work today.   History of diabetes mellitus - Hemoglobin A1c; Future

## 2020-07-19 NOTE — Progress Notes (Signed)
   CC: hypertension and generalized anxiety  HPI:[name] is a 60 y.o. female who presents for evaluation of hypertension and generalized anxiety. Please see individual problem based A/P for details.  Past Medical History:  Diagnosis Date  . Anxiety   . Arthritis   . Bipolar 1 disorder ([place])   . Depression   . Diabetes mellitus ([place]) 04/16/2007   Mild     . Diabetes mellitus type II   . Hyperlipidemia   . OCD (obsessive compulsive disorder)   . Polysubstance abuse ([facility])    hx of  . Polysubstance abuse ([place])    remote  . Tobacco abuse    Review of Systems:   Review of Systems  Constitutional: Negative for chills and fever.  Psychiatric/Behavioral: Positive for depression. The patient is nervous/anxious.      Physical Exam: Vitals:   07/19/20 0948  BP: (!) 146/88  Pulse: 72  Temp: 98.5 F (36.9 C)  TempSrc: Oral  SpO2: 98%  Weight: 162 lb 8 oz (73.7 kg)  Height: 5\' 7"  (1.702 m)   General: anxious appearing as I enter room, pleasant with further discussion  HEENT: Normocephalic, atraumatic , Conjunctiva nl  Cardiovascular: Normal rate, regular rhythm.  No murmurs, rubs, or gallops Pulmonary : Equal breath sounds, No wheezes, rales, or rhonchi Abdominal: soft, nontender,  bowel sounds present   Assessment & Plan:   See Encounters Tab for problem based charting.  Patient discussed with Dr. Jimmye Norman

## 2020-07-19 NOTE — Assessment & Plan Note (Signed)
Hypertension: Patient's BP today is 146/88 with a goal of <140/80. The patient endorses adherence to her medication regimen.  Reports her blood pressure has been letter in the past, but she is anxious today.  We will hold off on further increase of medication today. Plan: - lisinopril-hydrochlorothiazide (ZESTORETIC) 10-12.5 MG tablet; Take 1 tablet by mouth daily.  Dispense: 30 tablet; Refill: 1 - BMP8+Anion Gap; Future

## 2020-07-19 NOTE — Patient Instructions (Signed)
Thank you, [name] for allowing [place] to provide your care today. Today we discussed anxiety and blood pressure.    I have ordered the following labs for you:   Lab Orders     BMP8+Anion Gap     Hemoglobin A1c   Tests ordered today:    Referrals ordered today:   Referral Orders  No referral(s) requested today     I have ordered the following medication/changed the following medications:   Stop the following medications: Medications Discontinued During This Encounter  Medication Reason  . nicotine (NICODERM CQ - DOSED IN MG/24 HOURS) 14 mg/24hr patch Discontinued by provider  . ibuprofen (ADVIL,MOTRIN) 800 MG tablet Discontinued by provider  . lisinopril-hydrochlorothiazide (ZESTORETIC) 10-12.5 MG tablet Reorder     Start the following medications: Meds ordered this encounter  Medications  . lisinopril-hydrochlorothiazide (ZESTORETIC) 10-12.5 MG tablet    Sig: Take 1 tablet by mouth daily.    Dispense:  30 tablet    Refill:  1  . clonazePAM (KLONOPIN) 0.5 MG tablet    Sig: Take 1 tablet (0.5 mg total) by mouth daily.    Dispense:  30 tablet    Refill:  0     Follow up: 3 months   Your blood work is scheduled for you to come in within 2 weeks.   Should you have any questions or concerns please call the internal medicine clinic at [phone].      Tamsen Snider, M.D. [place]

## 2020-07-19 NOTE — Assessment & Plan Note (Signed)
Reports she follows with behavioral health.  She has been taking Paxil but is visibly anxious on exam.  Her follow-up appointment with her behavioral health counselor is March 1.  Reports she had a bad experience with one resident.  We discussed how to notify our practice manager if she has any concerns.  She also has a phobia of needles. She does well if she knows ahead of time she is getting blood work.Reports she is unable to do lab work today.  If we schedule lab work for the next 2 weeks she agrees to come in to have BMP and A1c completed.  Assessment: Anxiety is uncontrolled. I recommended discussing low dose of Clonazepam with her prescriber at behavior health. I will prescribe today and can continue to treat. However I would like to coordinate care and plan.   - Continue Paxil  - Follow up with [place] already scheduled  - clonazePAM (KLONOPIN) 0.5 MG tablet; Take 1 tablet (0.5 mg total) by mouth daily.  Dispense: 30 tablet; Refill: 0 - Follow up in 3 months.

## 2020-07-19 NOTE — Assessment & Plan Note (Signed)
   Encounter for screening mammogram for malignant neoplasm of breast - MM Digital Screening; Future  

## 2020-07-24 NOTE — Progress Notes (Signed)
Internal Medicine Clinic Attending ° °Case discussed with Dr. Steen  at the time of the visit.  We reviewed the resident’s history and exam and pertinent patient test results.  I agree with the assessment, diagnosis, and plan of care documented in the resident’s note.  °

## 2020-08-04 ENCOUNTER — Other Ambulatory Visit (INDEPENDENT_AMBULATORY_CARE_PROVIDER_SITE_OTHER): Payer: Medicare Other

## 2020-08-04 DIAGNOSIS — Z8639 Personal history of other endocrine, nutritional and metabolic disease: Secondary | ICD-10-CM | POA: Diagnosis not present

## 2020-08-04 DIAGNOSIS — I10 Essential (primary) hypertension: Secondary | ICD-10-CM

## 2020-08-05 LAB — BMP8+ANION GAP
Anion Gap: 14 mmol/L (ref 10.0–18.0)
BUN/Creatinine Ratio: 17 (ref 12–28)
BUN: 15 mg/dL (ref 8–27)
CO2: 22 mmol/L (ref 20–29)
Calcium: 9.5 mg/dL (ref 8.7–10.3)
Chloride: 102 mmol/L (ref 96–106)
Creatinine, Ser: 0.86 mg/dL (ref 0.57–1.00)
Glucose: 142 mg/dL — ABNORMAL HIGH (ref 65–99)
Potassium: 4.3 mmol/L (ref 3.5–5.2)
Sodium: 138 mmol/L (ref 134–144)
eGFR: 77 mL/min/{1.73_m2} (ref >59)

## 2020-08-05 LAB — HEMOGLOBIN A1C
Est. average glucose Bld gHb Est-mCnc: 123 mg/dL
Hgb A1c MFr Bld: 5.9 % — ABNORMAL HIGH (ref 4.8–5.6)

## 2020-09-12 ENCOUNTER — Other Ambulatory Visit: Payer: Self-pay

## 2020-09-12 ENCOUNTER — Ambulatory Visit (HOSPITAL_COMMUNITY)
Admission: RE | Admit: 2020-09-12 | Discharge: 2020-09-12 | Disposition: A | Discharge status: Home / Self Care | Payer: Medicare Other | Source: Ambulatory Visit | Attending: Internal Medicine | Admitting: Internal Medicine

## 2020-09-12 ENCOUNTER — Other Ambulatory Visit: Payer: Self-pay | Admitting: Student

## 2020-09-12 ENCOUNTER — Ambulatory Visit (INDEPENDENT_AMBULATORY_CARE_PROVIDER_SITE_OTHER): Payer: Medicare Other | Admitting: Student

## 2020-09-12 ENCOUNTER — Encounter: Payer: Self-pay | Admitting: Student

## 2020-09-12 VITALS — BP 124/83 | HR 62 | Temp 98.7°F | Ht 67.0 in | Wt 168.0 lb

## 2020-09-12 DIAGNOSIS — W57XXXA Bitten or stung by nonvenomous insect and other nonvenomous arthropods, initial encounter: Secondary | ICD-10-CM

## 2020-09-12 DIAGNOSIS — R079 Chest pain, unspecified: Secondary | ICD-10-CM

## 2020-09-12 DIAGNOSIS — S20361A Insect bite (nonvenomous) of right front wall of thorax, initial encounter: Secondary | ICD-10-CM | POA: Diagnosis not present

## 2020-09-12 DIAGNOSIS — I10 Essential (primary) hypertension: Secondary | ICD-10-CM

## 2020-09-12 DIAGNOSIS — L989 Disorder of the skin and subcutaneous tissue, unspecified: Secondary | ICD-10-CM

## 2020-09-12 NOTE — Patient Instructions (Addendum)
It was a pleasure seeing you in clinic. Today we discussed:   Bug bites:  Sorry to hear you are having trouble with bug bites, you EKG looks good on this visit and it is unlikely the tic would have spread any disease to you. Please follow up if your rash worsens or you develop new or worsening symptoms.   For the small bugs that bite you scalp and face please see if you can trap any and bring it to you next appointment so be can have a better idea of how to treat this. In the mean time you can try Sarna Sensitive Anti-Itch 1 Lotion to help with symptoms of itching or discomfort. You can pick this up over the counter.   If you have any questions or concerns, please call our clinic at [phone] between 9am-5pm and after hours call [phone] and ask for the internal medicine resident on call. If you feel you are having a medical emergency please call 911.   Thank you, we look forward to helping you remain healthy!   Tick Bite Information, Adult  Ticks are insects that can bite. Most ticks live in shrubs and grassy areas. They climb onto people and animals that go by. Then they bite. Some ticks carry germs that can make you sick. How can I prevent tick bites? Take these steps: Use insect repellent  Use an insect repellent that has 20% or higher of the ingredients DEET, picaridin, or IR3535. Follow the instructions on the label. Put it on: ? Bare skin. ? The tops of your boots. ? Your pant legs. ? The ends of your sleeves.  If you use an insect repellent that has the ingredient permethrin, follow the instructions on the label. Put it on: ? Clothing. ? Boots. ? Supplies or outdoor gear. ? Tents. When you are outside  Wear long sleeves and long pants.  Wear light-colored clothes.  Tuck your pant legs into your socks.  Stay in the middle of the trail. Do not touch the bushes.  Avoid walking through long grass.  Check for ticks on your clothes, hair, and skin often while you are  outside. Before going inside your house, check your clothes, skin, head, neck, armpits, waist, groin, and joint areas. When you go indoors  Check your clothes for ticks. Dry your clothes in a dryer on high heat for 10 minutes or more. If clothes are damp, additional time may be needed.  Wash your clothes right away if they need to be washed. Use hot water.  Check your pets and outdoor gear.  Shower right away.  Check your body for ticks. Do a full body check using a mirror. What is the right way to remove a tick? Remove the tick from your skin as soon as possible. Do not remove the tick with your bare fingers.  To remove a tick that is crawling on your skin: ? Go outdoors and brush the tick off. ? Use tape or a lint roller.  To remove a tick that is biting: 1. Wash your hands. 2. If you have latex gloves, put them on. 3. Use tweezers, curved forceps, or a tick-removal tool to grasp the tick. Grasp the tick as close to your skin and as close to the tick's head as possible. 4. Gently pull up until the tick lets go.  Try to keep the tick's head attached to its body.  Do not twist or jerk the tick.  Do not squeeze or crush the  tick. Do not try to remove a tick with heat, alcohol, petroleum jelly, or fingernail polish.   What should I do after taking out a tick?  Throw away the tick. Do not crush a tick with your fingers.  Clean the bite area and your hands with soap and water, rubbing alcohol, or an iodine wash.  If an antiseptic cream or ointment is available, apply a small amount to the bite area.  Wash and disinfect any instruments that you used to remove the tick. How should I get rid of a live tick? To dispose of a live tick, use one of these methods:  Place the tick in rubbing alcohol.  Place the tick in a bag or container you can close tightly.  Wrap the tick tightly in tape.  Flush the tick down the toilet. Contact a doctor if:  You have symptoms, such as: ? A  fever or chills. ? A red rash that makes a circle (bull's-eye rash) in the bite area. ? Redness and swelling where the tick bit you. ? Headache. ? Pain in a muscle, joint, or bone. ? Being more tired than normal. ? Trouble walking or moving your legs. ? Numbness in your legs. ? Tender and swollen lymph glands.  A part of a tick breaks off and gets stuck in your skin. Get help right away if:  You cannot remove a tick.  You cannot move (have paralysis) or feel weak.  You are feeling worse or have new symptoms.  You find a tick that is biting you and filled with blood. This is important if you are in an area where diseases from ticks are common. Summary  Ticks may carry germs that can make you sick.  To prevent tick bites wear long sleeves, long pants, and light colors. Use insect repellent. Follow the instructions on the label.  If the tick is biting, do not try to remove it with heat, alcohol, petroleum jelly, or fingernail polish.  Use tweezers, curved forceps, or a tick-removal tool to grasp the tick. Gently pull up until the tick lets go. Do not twist or jerk the tick. Do not squeeze or crush the tick.  If you have symptoms, contact a doctor. This information is not intended to replace advice given to you by your health care provider. Make sure you discuss any questions you have with your health care provider. Document Revised: 05/10/2019 Document Reviewed: 05/10/2019 Elsevier Patient Education  2021 [employer].

## 2020-09-14 DIAGNOSIS — W57XXXA Bitten or stung by nonvenomous insect and other nonvenomous arthropods, initial encounter: Secondary | ICD-10-CM | POA: Insufficient documentation

## 2020-09-14 NOTE — Assessment & Plan Note (Signed)
Reports she continues to have skin lesions of the scalp, face, and groin. Reports this is due infestation of tiny bugs or parasites. Sates she often is trying to comb them out of her hair. She has been unable to get photos of these. Denies lice, fleas, or bedbugs. States this is very distressing and causing her to develop small scabs in her scalp and face. Refer to photos for representative lesions. Noted to have similar areas of scabbing in posterior occiput. No bug notes on exam. Advised she keep areas clean and dry, recommended lotions to symptomatic treatment. Will have her try to obtain sample of bugs causing this with tape and bring to next office visit to discuss what may be causing these lesions.

## 2020-09-14 NOTE — Progress Notes (Signed)
   CC: Tic bite  HPI:  Tammy Barr is a 60 y.o. female who presents after tic bite. Please refer to problem based charting for further details and assessment and plan of current problem and chronic medical conditions.   Past Medical History:  Diagnosis Date  . Anxiety   . Arthritis   . Bipolar 1 disorder ([place])   . Depression   . Diabetes mellitus ([place]) 04/16/2007   Mild     . Diabetes mellitus type II   . Hyperlipidemia   . OCD (obsessive compulsive disorder)   . Polysubstance abuse ([facility])    hx of  . Polysubstance abuse ([place])    remote  . Tobacco abuse    Review of Systems:  Negative as per HPI  Physical Exam:  Vitals:   09/12/20 0946 09/12/20 0953  BP: (!) 144/82 124/83  Pulse: 62   Temp: 98.7 F (37.1 C)   TempSrc: Oral   SpO2: 96%   Weight: 168 lb (76.2 kg)   Height: 5\' 7"  (1.702 m)    Constitutional: Appears well-developed and well-nourished. HENT: Normocephalic and atraumatic, EOMI, conjunctiva normal, moist mucous membranes Cardiovascular: Normal rate, regular rhythm, S1 and S2 present, no murmurs, rubs, gallops.  Distal pulses intact Respiratory: No respiratory distress, no accessory muscle use.  Effort is normal.  Lungs are clear to auscultation bilaterally. GI: Nondistended, soft, nontender to palpation, normal active bowel sounds Musculoskeletal: Normal bulk and tone.  No peripheral edema noted. Neurological: Is alert and oriented x4, no apparent focal deficits noted. Skin: Two small areas of induration and of the right lateral ribs c/w insect bite with surrounding bruising. Several scattered areas with small scabbing on face and posterior occiput. See photos below Psychiatric: Anxious, normal affect   Media Information         Media Information          Assessment & Plan:   See Encounters Tab for problem based charting.  Patient discussed with Dr. Jimmye Norman

## 2020-09-14 NOTE — Assessment & Plan Note (Signed)
Patient reports tick bite 5 days ago after she was outside in some tall grass. Noticed tick attached to her that night and removed it. Notes tick was not engorged. Noted left arm pain radiating to the middle of her chest the next day associated with lifter her left arm. Pain lasted about 3 seconds and was sharp this happened several times over the course of a few hours. She was doing some light cleaning at that time. Has not reoccurred since then. Notes 3 days ago she had another insect bite next to the tic bit which she beleives is a spider bite. Notes itching and pain around both bites. Denies fevers. On exam there are 2 small areas of induration of the right lateral chest wall no significant rash, edema or tenderness, some surround bruising. No ticks on exam.  EKG with NSR without concerning acute changes. Reassured patient that based on timing she is unlikely to have lyme transmission. No indication of doxycyline treatment. Encouraged there to check for ticks in the evening is she has been outdoors during the day.

## 2020-09-18 NOTE — Progress Notes (Signed)
Internal Medicine Clinic Attending  Case discussed with Dr. Lisabeth Devoid  at the time of the visit.  We reviewed the resident's history and exam (including photos) and pertinent patient test results.  I agree with the assessment, diagnosis, and plan of care documented in the resident's note.

## 2020-10-16 ENCOUNTER — Encounter: Payer: Medicare Other | Admitting: Internal Medicine

## 2020-10-27 ENCOUNTER — Ambulatory Visit (INDEPENDENT_AMBULATORY_CARE_PROVIDER_SITE_OTHER): Payer: Medicare Other | Admitting: Internal Medicine

## 2020-10-27 DIAGNOSIS — F22 Delusional disorders: Secondary | ICD-10-CM

## 2020-10-27 DIAGNOSIS — F319 Bipolar disorder, unspecified: Secondary | ICD-10-CM | POA: Diagnosis not present

## 2020-10-27 DIAGNOSIS — I10 Essential (primary) hypertension: Secondary | ICD-10-CM | POA: Diagnosis not present

## 2020-10-27 NOTE — Progress Notes (Signed)
Acute Office Visit   Patient ID: Tammy Barr, female    DOB: 07/06/1960, 60 y.o.   MRN: [id number]   PCP: Madalyn Rob, MD   Subjective:  CC: bugs  Tammy Barr is a 60 y.o. year old female who presents for follow up from her April appointment with Dr. Lisabeth Devoid for evaluation of bug bites.   She notes that she has been struggling with small bugs crawling around her body and biting her for the past 2 years without improvement in symptoms. She says it all started around 2 years ago when she and her dog had gone to the beach. Upon returning home from the beach, her dog developed some lesions around his eyes which spontaneously cleared up on it's own. Shortly thereafter, she began developing her symptoms.  She says it started as a worm that was underneath her eye that would move around. This went away but was replaced by small bugs that crawl all over her body, biting her, and has continued to this day. She can feel them start on her scalp and work their way down her body, into her pubic hair, and down her legs. They do not cause itching. The sensation is constant throughout the day, but is worse at night. She has not experienced a period of relief over the past 2 years. She also notes that she has seen these bugs in her stools in the past, but not currently. She is able to see the bugs in her pick-comb after brushing her hair after she soaks her comb in hot water.  She also notes having these in her eyelashes, and notes that they look like spiders. It can make it difficult for her to see sometimes.   She does not believe that her dog has any fleas, lice, or other bug infestations. She reports that he is on preventative treatment.  She lives alone and does not come in close contact with other people.  She denies alcohol or illicit substance abuse.  She expresses that her symptoms have been very distressing for her. She is embarrassed about her situation and does not like to talk about it with her  family and seems to ultimately be isolating herself from others.  When asked what she believes may be going on, she feels fairly certain that it is Demodex infestation. She notes that she has been seen here a couple of times in the past and has undergone treatment with both permethrin cream and ivermectin without any improvement in symptoms.       ACTIVE MEDICATIONS   Outpatient Medications Prior to Visit  Medication Sig Dispense Refill  . lisinopril-hydrochlorothiazide (ZESTORETIC) 10-12.5 MG tablet Take 1 tablet by mouth once daily 90 tablet 0  . clonazePAM (KLONOPIN) 0.5 MG tablet Take 1 tablet (0.5 mg total) by mouth daily. 30 tablet 0  . PARoxetine (PAXIL) 20 MG tablet Take 20 mg by mouth every morning.       No facility-administered medications prior to visit.     Objective:   BP 128/85 (BP Location: Right Arm, Patient Position: Sitting, Cuff Size: Small)   Pulse 72   Temp 98.2 F (36.8 C) (Oral)   Ht 5\' 7"  (1.702 m)   Wt 161 lb 6.4 oz (73.2 kg)   SpO2 100%   BMI 25.28 kg/m  Wt Readings from Last 3 Encounters:  10/27/20 161 lb 6.4 oz (73.2 kg)  09/12/20 168 lb (76.2 kg)  07/19/20 162 lb 8 oz (73.7 kg)  BP Readings from Last 3 Encounters:  10/27/20 128/85  09/12/20 124/83  07/19/20 (!) 146/88   Physical Exam General: chronically ill appearing, somewhat anxious appearing Skin: small excoriations on her scalp. No lice or bug infestations apparent. No rash or lesions on her legs or arms. Psych: General Appearance: Fairly Groomed  Eye Contact:  Poor  Speech:  Clear and Coherent  Volume:  Normal  Mood:  Anxious  Thought Process:  Coherent and Linear  Orientation:  Full (Time, Place, and Person)  Thought Content:  Hallucinations: Tactile Visual and Obsessions  Suicidal Thoughts:  No  Insight:  poor    Health Maintenance:   Health Maintenance  Topic Date Due  . Pneumococcal Vaccine 18-56 Years old (1 of 2 - PPSV23) Never done  . Zoster Vaccines- Shingrix (1  of 2) Never done  . MAMMOGRAM  12/25/2012  . PAP SMEAR-Modifier  06/19/2018  . OPHTHALMOLOGY EXAM  07/25/2020  . COLON CANCER SCREENING ANNUAL FOBT  01/16/2021 (Originally 01/02/2018)  . FOOT EXAM  07/19/2021 (Originally 03/12/2019)  . COLONOSCOPY (Pts 45-26yrs Insurance coverage will need to be confirmed)  07/19/2021 (Originally 10/19/2004)  . INFLUENZA VACCINE  12/25/2020  . HEMOGLOBIN A1C  02/04/2021  . TETANUS/TDAP  10/21/2022  . PNEUMOCOCCAL POLYSACCHARIDE VACCINE AGE 74-64 HIGH RISK  Completed  . COVID-19 Vaccine  Completed  . Hepatitis C Screening  Completed  . HIV Screening  Completed  . HPV VACCINES  Aged Out     Assessment & Plan:   Problem List Items Addressed This Visit      Cardiovascular and Mediastinum   HTN (hypertension)    Blood pressure is at goal in the office today. Continue current plan.        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 Find prior encounter assessment and plan under "skin lesion".  Assessment: I suspect that her symptoms are psychogenic in nature.  She has undergone treatment with permethrin cream and ivermectin, which I confirmed in her chart, but has not experienced relief of symptoms.  We discussed the potential for this to be psychogenic which was understandably distressing and frustrating to her. Her belief of the infestation remained fixed after extensive discussion regarding the consideration of psychogenic roles.  She follows at [facility] for bipolar disorder however did not wish for me call to discuss with her provider over there. I explained my reasoning and asked which medications she is currently taking however she replied that it was private, and refused to share these with me.   Plan:  -I do not think further treatment is indicated at this time.  -She asked about a referral to dermatology however I do not think this would be helpful at this time either.  -Since she declined my  offer to discuss her symptoms with her psychiatrist at [facility], I recommended that she personally discuss it with him/her at her appointment, which she says is at the end of June.          Follow up with psychiatry at [facility].  Follow up with PCP, Dr. Court Joy, for management of her chronic medical conditions.  Pt discussed with Dr. Venetia Maxon, MD Internal Medicine Resident PGY-2 [facility] Pager: [phone] 10/27/2020 1:24 PM

## 2020-10-27 NOTE — Assessment & Plan Note (Signed)
Blood pressure is at goal in the office today. Continue current plan.

## 2020-10-27 NOTE — Assessment & Plan Note (Addendum)
Please see HPI under today's note. Find prior encounter assessment and plan under "skin lesion".  Assessment: I suspect that her symptoms are psychogenic in nature.  She has undergone treatment with permethrin cream and ivermectin, which I confirmed in her chart, but has not experienced relief of symptoms.  We discussed the potential for this to be psychogenic which was understandably distressing and frustrating to her. Her belief of the infestation remained fixed after extensive discussion regarding the consideration of psychogenic roles.  She follows at [facility] for bipolar disorder however did not wish for me call to discuss with her provider over there. I explained my reasoning and asked which medications she is currently taking however she replied that it was private, and refused to share these with me.   Plan:  -I do not think further treatment is indicated at this time.  -She asked about a referral to dermatology however I do not think this would be helpful at this time either.  -Since she declined my offer to discuss her symptoms with her psychiatrist at [facility], I recommended that she personally discuss it with him/her at her appointment, which she says is at the end of June.

## 2020-11-02 NOTE — Progress Notes (Signed)
Internal Medicine Clinic Attending  I saw and evaluated the patient.  I personally confirmed the key portions of the history and exam documented by Dr. Christian   and I reviewed pertinent patient test results.  The assessment, diagnosis, and plan were formulated together and I agree with the documentation in the resident's note.  

## 2020-11-06 ENCOUNTER — Other Ambulatory Visit: Payer: Self-pay | Admitting: Internal Medicine

## 2020-11-06 DIAGNOSIS — I10 Essential (primary) hypertension: Secondary | ICD-10-CM

## 2020-11-28 ENCOUNTER — Encounter: Payer: Self-pay | Admitting: *Deleted

## 2021-01-05 ENCOUNTER — Encounter: Payer: Self-pay | Admitting: *Deleted

## 2021-01-05 NOTE — Progress Notes (Unsigned)

## 2021-01-15 ENCOUNTER — Encounter: Payer: Self-pay | Admitting: Internal Medicine

## 2021-01-15 NOTE — Progress Notes (Unsigned)
Things That May Be Affecting Your Health:  Alcohol  Hearing loss  Pain   x Depression  Home Safety  Sexual Health   Diabetes x Lack of physical activity  Stress   Difficulty with daily activities  Loneliness  Tiredness   Drug use  Medicines x Tobacco use   Falls  Motor Vehicle Safety  Weight   Food choices  Oral Health  Other    YOUR PERSONALIZED HEALTH PLAN : 1. Schedule your next subsequent Medicare Wellness visit in one year 2. Attend all of your regular appointments to address your medical issues 3. Complete the preventative screenings and services   Annual Wellness Visit   Medicare Covered Preventative Screenings and [place] Men and Women Who How Often Need? Date of Last Service Action  Abdominal Aortic Aneurysm Adults with AAA risk factors Once      Alcohol Misuse and Counseling All Adults Screening once a year if no alcohol misuse. Counseling up to 4 face to face sessions.     Bone Density Measurement  Adults at risk for osteoporosis Once every 2 yrs      Lipid Panel Z13.6 All adults without CV disease Once every 5 yrs       Colorectal Cancer  Stool sample or Colonoscopy All adults 29 and older  Once every year Every 10 years        Depression All Adults Once a year  Today   Diabetes Screening Blood glucose, post glucose load, or GTT Z13.1 All adults at risk Pre-diabetics Once per year Twice per year      Diabetes  Self-Management Training All adults Diabetics 10 hrs first year; 2 hours subsequent years. Requires Copay     Glaucoma Diabetics Family history of glaucoma African Americans 58 yrs + Hispanic Americans 23 yrs + Annually - requires coppay      Hepatitis C Z72.89 or F19.20 High Risk for HCV Born between 1945 and 1965 Annually Once      HIV Z11.4 All adults based on risk Annually btw ages 47 & 67 regardless of risk Annually > 65 yrs if at increased risk      Lung Cancer Screening Asymptomatic adults aged 30-77 with 30  pack yr history and current smoker OR quit within the last 15 yrs Annually Must have counseling and shared decision making documentation before first screen      Medical Nutrition Therapy Adults with  Diabetes Renal disease Kidney transplant within past 3 yrs 3 hours first year; 2 hours subsequent years     Obesity and Counseling All adults Screening once a year Counseling if BMI 30 or higher  Today   Tobacco Use Counseling Adults who use tobacco  Up to 8 visits in one year x    Vaccines Z23 Hepatitis B Influenza  Pneumonia  Adults  Once Once every flu season Two different vaccines separated by one year x    Next Annual Wellness Visit People with Medicare Every year  Today     Services & Screenings Women Who How Often Need  Date of Last Service Action  Mammogram  Z12.31 Women over 62 One baseline ages 76-39. Annually ager 40 yrs+      Pap tests All women Annually if high risk. Every 2 yrs for normal risk women      Screening for cervical cancer with  Pap (Z01.419 nl or Z01.411abnl) & HPV Z11.51 Women aged 97 to 51 Once every 5 yrs x  Screening pelvic and breast exams All women Annually if high risk. Every 2 yrs for normal risk women     Sexually Transmitted Diseases Chlamydia Gonorrhea Syphilis All at risk adults Annually for non pregnant females at increased risk         [place] Men Who How Ofter Need  Date of Last Service Action  Prostate Cancer - DRE & PSA Men over 50 Annually.  DRE might require a copay.        Sexually Transmitted Diseases Syphilis All at risk adults Annually for men at increased risk      Health Maintenance List Health Maintenance  Topic Date Due   Zoster Vaccines- Shingrix (1 of 2) Never done   MAMMOGRAM  12/25/2012   PAP SMEAR-Modifier  06/19/2018   Pneumococcal Vaccine 62-67 Years old (2 - PCV) 03/12/2019   COVID-19 Vaccine (4 - Booster for Pfizer series) 07/09/2020   OPHTHALMOLOGY EXAM  07/25/2020   INFLUENZA  VACCINE  12/25/2020   COLON CANCER SCREENING ANNUAL FOBT  01/16/2021 (Originally 01/02/2018)   FOOT EXAM  07/19/2021 (Originally 03/12/2019)   COLONOSCOPY (Pts 45-22yr Insurance coverage will need to be confirmed)  07/19/2021 (Originally 10/19/2004)   HEMOGLOBIN A1C  02/04/2021   TETANUS/TDAP  10/21/2022   PNEUMOCOCCAL POLYSACCHARIDE VACCINE AGE 68-64 HIGH RISK  Completed   Hepatitis C Screening  Completed   HIV Screening  Completed   HPV VACCINES  Aged Out

## 2021-04-23 ENCOUNTER — Other Ambulatory Visit: Payer: Self-pay | Admitting: Internal Medicine

## 2021-04-23 ENCOUNTER — Telehealth: Payer: Self-pay

## 2021-04-23 DIAGNOSIS — I10 Essential (primary) hypertension: Secondary | ICD-10-CM

## 2021-04-23 NOTE — Telephone Encounter (Signed)
RTC to patient about phone call.  Stated has been taken care of.  When asked what she needed it was for her Lisinopril.    Sander Nephew, RN 04/23/2021 1:26 PM.

## 2021-04-23 NOTE — Telephone Encounter (Signed)
Requesting to speak with a nurse about getting bp medicine filled. Please call back.

## 2021-05-16 ENCOUNTER — Encounter: Payer: Medicare Other | Admitting: Internal Medicine

## 2021-06-19 ENCOUNTER — Ambulatory Visit: Payer: Medicare Other | Admitting: Physician Assistant

## 2021-08-21 ENCOUNTER — Other Ambulatory Visit: Payer: Self-pay | Admitting: Student

## 2021-08-21 DIAGNOSIS — E2839 Other primary ovarian failure: Secondary | ICD-10-CM

## 2021-08-23 ENCOUNTER — Other Ambulatory Visit: Payer: Self-pay | Admitting: Student

## 2021-08-23 DIAGNOSIS — M79606 Pain in leg, unspecified: Secondary | ICD-10-CM

## 2021-08-29 ENCOUNTER — Other Ambulatory Visit (HOSPITAL_BASED_OUTPATIENT_CLINIC_OR_DEPARTMENT_OTHER): Payer: Self-pay | Admitting: Student

## 2021-08-29 DIAGNOSIS — E2839 Other primary ovarian failure: Secondary | ICD-10-CM

## 2021-09-10 ENCOUNTER — Ambulatory Visit
Admission: RE | Admit: 2021-09-10 | Discharge: 2021-09-10 | Disposition: A | Discharge status: Home / Self Care | Payer: Medicare Other | Source: Ambulatory Visit | Attending: Student | Admitting: Student

## 2021-09-10 DIAGNOSIS — M79606 Pain in leg, unspecified: Secondary | ICD-10-CM

## 2021-11-13 ENCOUNTER — Ambulatory Visit (HOSPITAL_BASED_OUTPATIENT_CLINIC_OR_DEPARTMENT_OTHER): Payer: Medicare Other | Admitting: Radiology

## 2021-11-13 ENCOUNTER — Other Ambulatory Visit (HOSPITAL_BASED_OUTPATIENT_CLINIC_OR_DEPARTMENT_OTHER): Payer: Self-pay | Admitting: Student

## 2021-11-13 DIAGNOSIS — Z1231 Encounter for screening mammogram for malignant neoplasm of breast: Secondary | ICD-10-CM

## 2021-11-15 ENCOUNTER — Ambulatory Visit (HOSPITAL_BASED_OUTPATIENT_CLINIC_OR_DEPARTMENT_OTHER)
Admission: RE | Admit: 2021-11-15 | Discharge: 2021-11-15 | Disposition: A | Discharge status: Home / Self Care | Payer: Medicare Other | Source: Ambulatory Visit | Attending: Student | Admitting: Student

## 2021-11-15 DIAGNOSIS — E2839 Other primary ovarian failure: Secondary | ICD-10-CM | POA: Diagnosis present

## 2021-11-16 ENCOUNTER — Ambulatory Visit (HOSPITAL_BASED_OUTPATIENT_CLINIC_OR_DEPARTMENT_OTHER): Payer: Medicare Other | Admitting: Radiology

## 2021-12-23 ENCOUNTER — Ambulatory Visit (HOSPITAL_BASED_OUTPATIENT_CLINIC_OR_DEPARTMENT_OTHER): Payer: Medicare Other | Admitting: Radiology

## 2022-08-29 ENCOUNTER — Other Ambulatory Visit: Payer: Self-pay

## 2022-08-29 ENCOUNTER — Encounter (HOSPITAL_COMMUNITY): Payer: Self-pay | Admitting: Emergency Medicine

## 2022-08-29 ENCOUNTER — Ambulatory Visit (HOSPITAL_COMMUNITY)
Admission: EM | Admit: 2022-08-29 | Discharge: 2022-08-29 | Disposition: A | Discharge status: Home / Self Care | Payer: Medicare Other | Attending: Family Medicine | Admitting: Family Medicine

## 2022-08-29 DIAGNOSIS — L089 Local infection of the skin and subcutaneous tissue, unspecified: Secondary | ICD-10-CM

## 2022-08-29 DIAGNOSIS — Z23 Encounter for immunization: Secondary | ICD-10-CM | POA: Diagnosis not present

## 2022-08-29 DIAGNOSIS — S61210A Laceration without foreign body of right index finger without damage to nail, initial encounter: Secondary | ICD-10-CM

## 2022-08-29 MED ORDER — AMOXICILLIN-POT CLAVULANATE 875-125 MG PO TABS: 1.0000 | ORAL_TABLET | Freq: Two times a day (BID) | ORAL | 0 refills | Status: AC

## 2022-08-29 MED ORDER — TETANUS-DIPHTH-ACELL PERTUSSIS 5-2.5-18.5 LF-MCG/0.5 IM SUSY
0.5000 mL | PREFILLED_SYRINGE | Freq: Once | INTRAMUSCULAR | Status: AC
  Administered 2022-08-29: 0.5 mL via INTRAMUSCULAR

## 2022-08-29 MED ORDER — MUPIROCIN 2 % EX OINT: 1.0000 | TOPICAL_OINTMENT | Freq: Two times a day (BID) | CUTANEOUS | 0 refills | Status: AC

## 2022-08-29 MED ORDER — TETANUS-DIPHTH-ACELL PERTUSSIS 5-2.5-18.5 LF-MCG/0.5 IM SUSY
PREFILLED_SYRINGE | INTRAMUSCULAR | Status: AC
  Filled 2022-08-29: qty 0.5

## 2022-08-29 NOTE — ED Triage Notes (Signed)
Patient reports on last Thursday 3/28, while washing dishes broke a glass and cut right hand at base of right index finger.  Patient did not get any medical attention-unable to afford financially.  Today has noticed redness to wound.  Wound is a partial avulsion, tissue appears intact, edges appear smooth   Patient does not know when last tetanus was.    Patient has been applying neosporin.

## 2022-08-29 NOTE — Discharge Instructions (Addendum)
Take amoxicillin-clavulanate 875 mg--1 tab twice daily with food for 7 days  Put mupirocin ointment on the sore areas twice daily until improved  You have been given a Tdap vaccination and boost your tetanus immunity

## 2022-08-29 NOTE — ED Provider Notes (Addendum)
[place]    CSN: [id number] Arrival date & time: 08/29/22  1424      History   Chief Complaint Chief Complaint  Patient presents with   Wound Check    HPI Tammy Barr is a 62 y.o. female.    Wound Check   Here for infected wound on her right hand.  About 7 days ago she was washing dishes when a glass broke and cut her right hand over the proximal phalanx of her right index finger.  She could not afford to be seen for the cut and got some recommendations from the [place] healthcare nurse about how to take care of it today she comes in because in the last 24 hours she has noticed redness to the wound.  It also is a little more sore.  She is not allergic to medications. She states she thinks it has been about 10 years or more since her last tetanus.  On review of the chart her Tdap is documented in 2014, 10 years ago.   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   Insect bite 09/14/2020   Screening for breast cancer 07/19/2020   Skin lesion 11/26/2019   Opioid use disorder, moderate, in early remission 10/07/2016   Liver fibrosis 11/21/2015   History of hepatitis C 07/10/2015   Healthcare maintenance 03/07/2015   Smoking trying to quit 02/13/2011   History of diabetes mellitus 04/16/2007   HTN (hypertension) 04/16/2007   H/O hyperlipidemia 12/19/2006   Depressed bipolar I disorder in full remission ([place]) 12/19/2006   Anxiety state 12/19/2006    Past Surgical History:  Procedure Laterality Date   DILATION AND CURETTAGE OF UTERUS     TONSILLECTOMY     TUBAL LIGATION      OB History    No obstetric history on file.      Home Medications    Prior to Admission medications   Medication Sig Start Date End Date Taking? Authorizing Provider  amoxicillin-clavulanate (AUGMENTIN) 875-125 MG tablet Take 1 tablet by mouth 2 (two) times daily for 7 days. 08/29/22 09/05/22 Yes Annahi Short, Gwenlyn Perking, MD  mupirocin ointment (BACTROBAN) 2 % Apply 1 Application topically 2 (two) times daily. To affected area till better 08/29/22  Yes Deborra Phegley, Gwenlyn Perking, MD  clonazePAM (KLONOPIN) 0.5 MG tablet Take 1 tablet (0.5 mg total) by mouth daily. Patient not taking: Reported on 08/29/2022 07/19/20 08/18/20  Lyndal Pulley, MD  levocetirizine (XYZAL) 5 MG tablet TAKE 1 TABLET BY MOUTH EVERY EVENING FOR ALLERGY/CHRONIC LUNG DISEASE    [provider]  lisinopril-hydrochlorothiazide (ZESTORETIC) 10-12.5 MG tablet Take 1 tablet by mouth daily. 04/23/21   Lyndal Pulley, MD  PARoxetine (PAXIL) 20 MG tablet Take 20 mg by mouth every morning.      [provider]  rosuvastatin (CRESTOR) 5 MG tablet Take 1 tablet by mouth daily.    [provider]    Family History Family History  Problem Relation Age of Onset  Diabetes Mother    Breast cancer Maternal Grandmother        Late 24s   Diabetes Maternal Grandfather     Social History Social History   Tobacco Use   Smoking status: Every Day    Packs/day: 0.10    Years: 40.00    Additional pack years: 0.00    Total pack years: 4.00    Types: Cigarettes   Smokeless tobacco: Never   Tobacco comments:    "ready to cut back, but not quit" 3 cigs per day  Vaping Use   Vaping Use: Never used  Substance Use Topics   Alcohol use: No    Alcohol/week: 0.0 standard drinks of alcohol    Comment: former ETOH abuse. now social drinker, completed treatment program   Drug use: No    Comment: cocaine use >20 years ago     Allergies   Patient has no active allergies.   Review of Systems Review of Systems   Physical Exam Triage  Vital Signs ED Triage Vitals  Enc Vitals Group     BP 08/29/22 1522 (!) 150/81     Pulse Rate 08/29/22 1522 70     Resp 08/29/22 1522 20     Temp 08/29/22 1522 98.6 F (37 C)     Temp Source 08/29/22 1522 Oral     SpO2 08/29/22 1522 94 %     Weight --      Height --      Head Circumference --      Peak Flow --      Pain Score 08/29/22 1520 4     Pain Loc --      Pain Edu? --      Excl. in [place]? --    No data found.  Updated Vital Signs BP (!) 150/81 (BP Location: Right Arm) Comment: has not taken blood pressure medicine  Pulse 70   Temp 98.6 F (37 C) (Oral)   Resp 20   SpO2 94%   Visual Acuity Right Eye Distance:   Left Eye Distance:   Bilateral Distance:    Right Eye Near:   Left Eye Near:    Bilateral Near:     Physical Exam Vitals reviewed.  Constitutional:      General: She is not in acute distress.    Appearance: She is not toxic-appearing.  Skin:    Coloration: Skin is not pale.     Comments: There is an area of erythema about 2 cm in diameter that is mostly a flap of skin.  On the medial aspect he can see the laceration which is healing.  There is little erythema on the lateral aspect of this area.  There is no fluctuance.  Capillary refill is intact  Neurological:     Mental Status: She is alert and oriented to person, place, and time.  Psychiatric:        Behavior: Behavior normal.      UC Treatments / Results  Labs (all labs ordered are listed, but only abnormal results are displayed) Labs Reviewed - No data to display  EKG   Radiology No results found.  Procedures Procedures (including critical care time)  Medications Ordered in UC Medications  Tdap (BOOSTRIX) injection 0.5 mL (has no administration in time range)    Initial Impression / Assessment and Plan / UC Course  I have reviewed the triage vital signs and the nursing notes.  Pertinent labs & imaging results that were available during my  care of the patient were reviewed by  me and considered in my medical decision making (see chart for details).        Augmentin twice a day is sent in for the wound infection, and mupirocin is sent in to treat topically.  We discussed wound care Final Clinical Impressions(s) / UC Diagnoses   Final diagnoses:  Wound infection     Discharge Instructions      Take amoxicillin-clavulanate 875 mg--1 tab twice daily with food for 7 days  Put mupirocin ointment on the sore areas twice daily until improved  You have been given a Tdap vaccination and boost your tetanus immunity          ED Prescriptions     Medication Sig Dispense Auth. Provider   amoxicillin-clavulanate (AUGMENTIN) 875-125 MG tablet Take 1 tablet by mouth 2 (two) times daily for 7 days. 14 tablet Topher Buenaventura, Gwenlyn Perking, MD   mupirocin ointment (BACTROBAN) 2 % Apply 1 Application topically 2 (two) times daily. To affected area till better 22 g Patra Gherardi, Gwenlyn Perking, MD      I have reviewed the PDMP during this encounter.   Barrett Henle, MD 08/29/22 1616    Barrett Henle, MD 08/29/22 [phone]

## 2023-03-13 ENCOUNTER — Other Ambulatory Visit: Payer: Self-pay | Admitting: Student

## 2023-03-13 DIAGNOSIS — B182 Chronic viral hepatitis C: Secondary | ICD-10-CM

## 2023-03-27 ENCOUNTER — Ambulatory Visit
Admission: RE | Admit: 2023-03-27 | Discharge: 2023-03-27 | Disposition: A | Discharge status: Home / Self Care | Payer: Medicare Other | Source: Ambulatory Visit | Attending: Student | Admitting: Student

## 2023-03-27 DIAGNOSIS — B182 Chronic viral hepatitis C: Secondary | ICD-10-CM
